# Patient Record
Sex: Female | Born: 1965 | Race: White | Hispanic: No | Marital: Married | Smoking: Never smoker
Health system: Southern US, Community
[De-identification: ages and names within clinical notes are randomized; demographics above are authoritative.]

## PROBLEM LIST (undated history)

## (undated) DIAGNOSIS — I10 Essential (primary) hypertension: Secondary | ICD-10-CM

## (undated) DIAGNOSIS — G009 Bacterial meningitis, unspecified: Secondary | ICD-10-CM

## (undated) DIAGNOSIS — T7840XA Allergy, unspecified, initial encounter: Secondary | ICD-10-CM

## (undated) DIAGNOSIS — K219 Gastro-esophageal reflux disease without esophagitis: Secondary | ICD-10-CM

## (undated) DIAGNOSIS — F419 Anxiety disorder, unspecified: Secondary | ICD-10-CM

## (undated) HISTORY — DX: Essential (primary) hypertension: I10

## (undated) HISTORY — PX: OTHER SURGICAL HISTORY: SHX169

## (undated) HISTORY — DX: Gastro-esophageal reflux disease without esophagitis: K21.9

## (undated) HISTORY — PX: NO PAST SURGERIES: SHX2092

## (undated) HISTORY — DX: Allergy, unspecified, initial encounter: T78.40XA

## (undated) HISTORY — DX: Bacterial meningitis, unspecified: G00.9

## (undated) HISTORY — DX: Anxiety disorder, unspecified: F41.9

---
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
## 1986-01-11 HISTORY — PX: WISDOM TOOTH EXTRACTION: SHX21

## 1998-02-07 ENCOUNTER — Other Ambulatory Visit: Admission: RE | Admit: 1998-02-07 | Discharge: 1998-02-07 | Payer: Self-pay | Admitting: Obstetrics and Gynecology

## 1998-06-03 ENCOUNTER — Ambulatory Visit (HOSPITAL_COMMUNITY): Admission: RE | Admit: 1998-06-03 | Discharge: 1998-06-03 | Payer: Self-pay | Admitting: Obstetrics and Gynecology

## 1998-06-03 ENCOUNTER — Encounter: Payer: Self-pay | Admitting: Obstetrics and Gynecology

## 1998-06-19 ENCOUNTER — Other Ambulatory Visit: Admission: RE | Admit: 1998-06-19 | Discharge: 1998-06-19 | Payer: Self-pay | Admitting: Obstetrics and Gynecology

## 2007-06-12 ENCOUNTER — Emergency Department: Payer: Self-pay | Admitting: Emergency Medicine

## 2007-08-14 ENCOUNTER — Ambulatory Visit: Payer: Self-pay | Admitting: Family Medicine

## 2011-08-13 ENCOUNTER — Other Ambulatory Visit: Payer: Self-pay | Admitting: Obstetrics and Gynecology

## 2011-08-13 DIAGNOSIS — R928 Other abnormal and inconclusive findings on diagnostic imaging of breast: Secondary | ICD-10-CM

## 2011-08-26 ENCOUNTER — Ambulatory Visit
Admission: RE | Admit: 2011-08-26 | Discharge: 2011-08-26 | Disposition: A | Discharge status: Home / Self Care | Payer: BC Managed Care – PPO | Source: Ambulatory Visit | Attending: Obstetrics and Gynecology | Admitting: Obstetrics and Gynecology

## 2011-08-26 DIAGNOSIS — R928 Other abnormal and inconclusive findings on diagnostic imaging of breast: Secondary | ICD-10-CM

## 2014-07-20 ENCOUNTER — Ambulatory Visit (INDEPENDENT_AMBULATORY_CARE_PROVIDER_SITE_OTHER): Payer: BLUE CROSS/BLUE SHIELD | Admitting: Urgent Care

## 2014-07-20 VITALS — BP 120/80 | HR 78 | Temp 97.8°F | Ht 65.0 in | Wt 152.5 lb

## 2014-07-20 DIAGNOSIS — J029 Acute pharyngitis, unspecified: Secondary | ICD-10-CM

## 2014-07-20 LAB — POCT RAPID STREP A (OFFICE): Rapid Strep A Screen: NEGATIVE

## 2014-07-20 NOTE — Progress Notes (Signed)
    MRN: [id number] DOB: [birth year]  Subjective:   Kaylee Lopez is a 49 y.o. female presenting for chief complaint of Sore Throat  Reports 3 day history of sore throat and chest tightness, malaise, nausea and stomach upset. Has not tried any medications for relief. Denies subjective fever, sinus congestion, sinus pain, rhinorrhea, red eyes, ear pain, ear drainage, wheezing, shortness of breath, chest pain, myalgia and cough, vomiting, abdominal pain and diarrhea. Has had multiple sick contacts with Strep including her husband and extended family. Denies any other aggravating or relieving factors, no other questions or concerns.  Kaylee Lopez has a current medication list which includes the following prescription(s): loratadine. She has No Known Allergies.  Kaylee Lopez  has a past medical history of Allergy. Also  has no past surgical history on file.  ROS As in subjective.  Objective:   Vitals: BP 120/80 mmHg  Pulse 78  Temp(Src) 97.8 F (36.6 C) (Oral)  Ht 5\' 5"  (1.651 m)  Wt 152 lb 8 oz (69.174 kg)  BMI 25.38 kg/m2  SpO2 99%  LMP 07/06/2014 (Approximate)  Physical Exam  Constitutional: She is oriented to person, place, and time. She appears well-developed and well-nourished.  HENT:  TM's intact bilaterally, no effusions or erythema. Nasal turbinates pink and moist with mucus. No sinus tenderness. Postnasal drip present but without oropharyngeal erythema, exudates or abscesses.  Eyes: Conjunctivae are normal. Right eye exhibits no discharge. Left eye exhibits no discharge. No scleral icterus.  Neck: Normal range of motion. Neck supple.  Cardiovascular: Normal rate, regular rhythm and intact distal pulses.  Exam reveals no gallop and no friction rub.   No murmur heard. Pulmonary/Chest: No stridor. No respiratory distress. She has no wheezes. She has no rales.  Abdominal: Soft. Bowel sounds are normal. She exhibits no distension and no mass. There is no tenderness.    Lymphadenopathy:    She has cervical adenopathy (bilateral, anterior).  Neurological: She is alert and oriented to person, place, and time.  Skin: Skin is warm and dry. No rash noted. No erythema. No pallor.   Results for orders placed or performed in visit on 07/20/14 (from the past 24 hour(s))  POCT rapid strep A     Status: None   Collection Time: 07/20/14 12:51 PM  Result Value Ref Range   Rapid Strep A Screen Negative Negative   Assessment and Plan :   1. Sore throat - Strep culture pending, offered antibiotic course given exposure to both her brother and father who both tested positive for Strep throat but mother declined and said she'd like to wait for strep culture.  - Advised she call me if symptoms worsen or fail to improve and I'll send out rx for amoxicillin. Mother agreed.   Kaylee Eagles, PA-C [facility] [phone] 07/20/2014 12:28 PM

## 2014-07-20 NOTE — Patient Instructions (Signed)

## 2014-07-22 LAB — CULTURE, GROUP A STREP: Organism ID, Bacteria: NORMAL

## 2014-08-05 ENCOUNTER — Ambulatory Visit (INDEPENDENT_AMBULATORY_CARE_PROVIDER_SITE_OTHER): Payer: BLUE CROSS/BLUE SHIELD | Admitting: Physician Assistant

## 2014-08-05 VITALS — BP 112/74 | HR 71 | Temp 98.2°F | Resp 18 | Ht 65.0 in | Wt 153.0 lb

## 2014-08-05 DIAGNOSIS — R109 Unspecified abdominal pain: Secondary | ICD-10-CM | POA: Diagnosis not present

## 2014-08-05 DIAGNOSIS — R1031 Right lower quadrant pain: Secondary | ICD-10-CM

## 2014-08-05 DIAGNOSIS — R110 Nausea: Secondary | ICD-10-CM | POA: Diagnosis not present

## 2014-08-05 LAB — COMPREHENSIVE METABOLIC PANEL
ALT: 10 U/L (ref 6–29)
AST: 15 U/L (ref 10–35)
Albumin: 4.6 g/dL (ref 3.6–5.1)
Alkaline Phosphatase: 46 U/L (ref 33–115)
BUN: 12 mg/dL (ref 7–25)
CALCIUM: 9.4 mg/dL (ref 8.6–10.2)
CHLORIDE: 102 mmol/L (ref 98–110)
CO2: 24 mmol/L (ref 20–31)
CREATININE: 0.62 mg/dL (ref 0.50–1.10)
Glucose, Bld: 77 mg/dL (ref 65–99)
POTASSIUM: 3.7 mmol/L (ref 3.5–5.3)
Sodium: 137 mmol/L (ref 135–146)
Total Bilirubin: 1.2 mg/dL (ref 0.2–1.2)
Total Protein: 7.1 g/dL (ref 6.1–8.1)

## 2014-08-05 LAB — POCT CBC
GRANULOCYTE PERCENT: 63.3 % (ref 37–80)
HEMATOCRIT: 41 % (ref 37.7–47.9)
Hemoglobin: 13.7 g/dL (ref 12.2–16.2)
Lymph, poc: 2.4 (ref 0.6–3.4)
MCH: 29.9 pg (ref 27–31.2)
MCHC: 33.4 g/dL (ref 31.8–35.4)
MCV: 89.5 fL (ref 80–97)
MID (cbc): 0.7 (ref 0–0.9)
MPV: 7.6 fL (ref 0–99.8)
POC Granulocyte: 5.3 (ref 2–6.9)
POC LYMPH %: 28.7 % (ref 10–50)
POC MID %: 8 %M (ref 0–12)
Platelet Count, POC: 249 10*3/uL (ref 142–424)
RBC: 4.58 M/uL (ref 4.04–5.48)
RDW, POC: 12.5 %
WBC: 8.3 10*3/uL (ref 4.6–10.2)

## 2014-08-05 LAB — POCT URINALYSIS DIPSTICK
Bilirubin, UA: NEGATIVE
Glucose, UA: NEGATIVE
Leukocytes, UA: NEGATIVE
Nitrite, UA: NEGATIVE
Protein, UA: NEGATIVE
Spec Grav, UA: <=1.005
Urobilinogen, UA: 0.2
pH, UA: 5.5

## 2014-08-05 LAB — POCT URINE PREGNANCY: Preg Test, Ur: NEGATIVE

## 2014-08-05 LAB — POCT UA - MICROSCOPIC ONLY
BACTERIA, U MICROSCOPIC: NEGATIVE
CASTS, UR, LPF, POC: NEGATIVE
Crystals, Ur, HPF, POC: NEGATIVE
Mucus, UA: NEGATIVE
WBC, UR, HPF, POC: NEGATIVE
YEAST UA: NEGATIVE

## 2014-08-05 MED ORDER — POLYETHYLENE GLYCOL 3350 17 GM/SCOOP PO POWD: 17.0000 g | Freq: Two times a day (BID) | ORAL | Status: AC | PRN

## 2014-08-05 NOTE — Progress Notes (Signed)
Subjective:    Patient ID: Kaylee Lopez, female    DOB: [birth year], 49 y.o.   MRN: [id number]  Chief Complaint  Patient presents with  . Urinary Tract Infection    x 1 week, right flank pain  . Abdominal Pain  . Nausea   Medications, allergies, past medical history, surgical history, family history, social history and problem list reviewed and updated.  HPI  49 yof presents with above complaints.   Sx started one wk ago with sharp, shooting right lower quad pain. Intermittent for past week. At times pain will radiate across lower abd. Pain brought on and aggravated by certain movements such as getting in and out of car, moving right foot between pedals. Went cycling this weekend and did not aggravate pain.   Pain not assoc with meals. Denies fevers, chills. Denies emesis, diarrhea. Mild nausea past few days. No dysuria, urinary freq/urgency, vaginal dc, odor, dyspareunia. She has normal bm approx every other day. Unsure if she is constipated.   Review of Systems See HPI.     Objective:   Physical Exam  Constitutional: She is oriented to person, place, and time. She appears well-developed and well-nourished.  Non-toxic appearance. She does not have a sickly appearance. She does not appear ill. No distress.  BP 112/74 mmHg  Pulse 71  Temp(Src) 98.2 F (36.8 C)  Resp 18  Ht 5\' 5"  (1.651 m)  Wt 153 lb (69.4 kg)  BMI 25.46 kg/m2  SpO2 98%  LMP 07/06/2014 (Approximate)   Cardiovascular: Normal rate, regular rhythm and normal heart sounds.   Pulmonary/Chest: Effort normal and breath sounds normal.  Abdominal: Soft. Normal appearance and bowel sounds are normal. There is no hepatosplenomegaly. There is tenderness in the right lower quadrant, periumbilical area and suprapubic area. There is tenderness at McBurney's point. There is no rigidity, no rebound, no guarding, no CVA tenderness and negative Murphy's sign.  Mild-mod RLQ ttp. Positive Rovsing's.   Genitourinary: Uterus  is not tender. Cervix exhibits no motion tenderness and no discharge. Right adnexum displays no mass, no tenderness and no fullness. Left adnexum displays no mass, no tenderness and no fullness. No tenderness in the vagina. No vaginal discharge found.  Lymphadenopathy:       Right: No inguinal adenopathy present.       Left: No inguinal adenopathy present.  Neurological: She is alert and oriented to person, place, and time.  Psychiatric: She has a normal mood and affect. Her speech is normal and behavior is normal.   Results for orders placed or performed in visit on 08/05/14  POCT urinalysis dipstick  Result Value Ref Range   Color, UA yellow    Clarity, UA clear    Glucose, UA neg    Bilirubin, UA neg    Ketones, UA trace    Spec Grav, UA <=1.005    Blood, UA small    pH, UA 5.5    Protein, UA neg    Urobilinogen, UA 0.2    Nitrite, UA neg    Leukocytes, UA Negative Negative  POCT UA - Microscopic Only  Result Value Ref Range   WBC, Ur, HPF, POC neg    RBC, urine, microscopic 0-1    Bacteria, U Microscopic neg    Mucus, UA neg    Epithelial cells, urine per micros 0-1    Crystals, Ur, HPF, POC neg    Casts, Ur, LPF, POC neg    Yeast, UA neg   POCT CBC  Result  Value Ref Range   WBC 8.3 4.6 - 10.2 K/uL   Lymph, poc 2.4 0.6 - 3.4   POC LYMPH PERCENT 28.7 10 - 50 %L   MID (cbc) 0.7 0 - 0.9   POC MID % 8.0 0 - 12 %M   POC Granulocyte 5.3 2 - 6.9   Granulocyte percent 63.3 37 - 80 %G   RBC 4.58 4.04 - 5.48 M/uL   Hemoglobin 13.7 12.2 - 16.2 g/dL   HCT, POC 41.0 37.7 - 47.9 %   MCV 89.5 80 - 97 fL   MCH, POC 29.9 27 - 31.2 pg   MCHC 33.4 31.8 - 35.4 g/dL   RDW, POC 12.5 %   Platelet Count, POC 249 142 - 424 K/uL   MPV 7.6 0 - 99.8 fL  POCT urine pregnancy  Result Value Ref Range   Preg Test, Ur Negative Negative      Assessment & Plan:   Right lower quadrant pain - Plan: POCT CBC, POCT urine pregnancy, Comprehensive metabolic panel, polyethylene glycol powder  (GLYCOLAX/MIRALAX) powder  Rt flank pain - Plan: POCT urinalysis dipstick, POCT UA - Microscopic Only, Comprehensive metabolic panel  Nausea without vomiting - Plan: POCT urine pregnancy, Comprehensive metabolic panel --no leukocytosis, normal ua, neg urine preg all normal and reassuring --ttp rlq and positive rovsings but no guarding, rebound, or peritoneal signs --normal vaginal exam with no cmt --suspect constipation vs ms strain --miralax, information on constipation given/reviewed --if no relief one week rtc for further workup, possible imaging --er with fevers, chills, worsening pain  Julieta Gutting, PA-C Physician Assistant[facility]  08/05/2014 7:06 PM

## 2014-08-05 NOTE — Patient Instructions (Signed)
Please take the miralax once daily for the next 1-2 weeks. If the pain does not resolve with this please let us know. If the pain worsens or you start having fevers or chills please let us know asap or go to the ER.   Constipation Constipation is when a person has fewer than three bowel movements a week, has difficulty having a bowel movement, or has stools that are dry, hard, or larger than normal. As people grow older, constipation is more common. If you try to fix constipation with medicines that make you have a bowel movement (laxatives), the problem may get worse. Long-term laxative use may cause the muscles of the colon to become weak. A low-fiber diet, not taking in enough fluids, and taking certain medicines may make constipation worse.  CAUSES   Certain medicines, such as antidepressants, pain medicine, iron supplements, antacids, and water pills.   Certain diseases, such as diabetes, irritable bowel syndrome (IBS), thyroid disease, or depression.   Not drinking enough water.   Not eating enough fiber-rich foods.   Stress or travel.   Lack of physical activity or exercise.   Ignoring the urge to have a bowel movement.   Using laxatives too much.  SIGNS AND SYMPTOMS   Having fewer than three bowel movements a week.   Straining to have a bowel movement.   Having stools that are hard, dry, or larger than normal.   Feeling full or bloated.   Pain in the lower abdomen.   Not feeling relief after having a bowel movement.  DIAGNOSIS  Your health care provider will take a medical history and perform a physical exam. Further testing may be done for severe constipation. Some tests may include:  A barium enema X-ray to examine your rectum, colon, and, sometimes, your small intestine.   A sigmoidoscopy to examine your lower colon.   A colonoscopy to examine your entire colon. TREATMENT  Treatment will depend on the severity of your constipation and what is  causing it. Some dietary treatments include drinking more fluids and eating more fiber-rich foods. Lifestyle treatments may include regular exercise. If these diet and lifestyle recommendations do not help, your health care provider may recommend taking over-the-counter laxative medicines to help you have bowel movements. Prescription medicines may be prescribed if over-the-counter medicines do not work.  HOME CARE INSTRUCTIONS   Eat foods that have a lot of fiber, such as fruits, vegetables, whole grains, and beans.  Limit foods high in fat and processed sugars, such as french fries, hamburgers, cookies, candies, and soda.   A fiber supplement may be added to your diet if you cannot get enough fiber from foods.   Drink enough fluids to keep your urine clear or pale yellow.   Exercise regularly or as directed by your health care provider.   Go to the restroom when you have the urge to go. Do not hold it.   Only take over-the-counter or prescription medicines as directed by your health care provider. Do not take other medicines for constipation without talking to your health care provider first.  [place] IF:   You have bright red blood in your stool.   Your constipation lasts for more than 4 days or gets worse.   You have abdominal or rectal pain.   You have thin, pencil-like stools.   You have unexplained weight loss. MAKE SURE YOU:   Understand these instructions.  Will watch your condition.  Will get help right away  if you are not doing well or get worse. Document Released: 09/26/2003 Document Revised: 01/02/2013 Document Reviewed: 10/09/2012 [facility] Patient Information 2015 [place], [place]. This information is not intended to replace advice given to you by your health care provider. Make sure you discuss any questions you have with your health care provider.

## 2014-09-26 ENCOUNTER — Other Ambulatory Visit: Payer: Self-pay | Admitting: Obstetrics and Gynecology

## 2014-09-26 DIAGNOSIS — Z803 Family history of malignant neoplasm of breast: Secondary | ICD-10-CM

## 2014-10-15 ENCOUNTER — Ambulatory Visit
Admission: RE | Admit: 2014-10-15 | Discharge: 2014-10-15 | Disposition: A | Discharge status: Home / Self Care | Payer: BLUE CROSS/BLUE SHIELD | Source: Ambulatory Visit | Attending: Obstetrics and Gynecology | Admitting: Obstetrics and Gynecology

## 2014-10-15 DIAGNOSIS — Z803 Family history of malignant neoplasm of breast: Secondary | ICD-10-CM

## 2014-10-15 MED ORDER — GADOBENATE DIMEGLUMINE 529 MG/ML IV SOLN
14.0000 mL | Freq: Once | INTRAVENOUS | Status: AC | PRN
  Administered 2014-10-15: 14 mL via INTRAVENOUS

## 2014-10-16 ENCOUNTER — Other Ambulatory Visit: Payer: Self-pay | Admitting: Obstetrics and Gynecology

## 2014-10-16 DIAGNOSIS — Z803 Family history of malignant neoplasm of breast: Secondary | ICD-10-CM

## 2014-10-18 ENCOUNTER — Other Ambulatory Visit: Payer: Self-pay

## 2014-10-18 ENCOUNTER — Ambulatory Visit
Admission: RE | Admit: 2014-10-18 | Discharge: 2014-10-18 | Disposition: A | Discharge status: Home / Self Care | Payer: BLUE CROSS/BLUE SHIELD | Source: Ambulatory Visit | Attending: Obstetrics and Gynecology | Admitting: Obstetrics and Gynecology

## 2014-10-18 DIAGNOSIS — Z803 Family history of malignant neoplasm of breast: Secondary | ICD-10-CM

## 2014-10-29 ENCOUNTER — Other Ambulatory Visit: Payer: BLUE CROSS/BLUE SHIELD

## 2016-01-12 HISTORY — PX: COLONOSCOPY: SHX174

## 2016-01-12 HISTORY — PX: POLYPECTOMY: SHX149

## 2016-04-02 ENCOUNTER — Ambulatory Visit (INDEPENDENT_AMBULATORY_CARE_PROVIDER_SITE_OTHER): Payer: BLUE CROSS/BLUE SHIELD | Admitting: Family Medicine

## 2016-04-02 VITALS — BP 142/86 | HR 72 | Temp 97.7°F | Resp 18 | Ht 65.0 in | Wt 177.0 lb

## 2016-04-02 DIAGNOSIS — J302 Other seasonal allergic rhinitis: Secondary | ICD-10-CM | POA: Diagnosis not present

## 2016-04-02 DIAGNOSIS — H6123 Impacted cerumen, bilateral: Secondary | ICD-10-CM

## 2016-04-02 DIAGNOSIS — R030 Elevated blood-pressure reading, without diagnosis of hypertension: Secondary | ICD-10-CM

## 2016-04-02 DIAGNOSIS — H65192 Other acute nonsuppurative otitis media, left ear: Secondary | ICD-10-CM

## 2016-04-02 DIAGNOSIS — H93A1 Pulsatile tinnitus, right ear: Secondary | ICD-10-CM

## 2016-04-02 MED ORDER — CETIRIZINE HCL 10 MG PO TABS: 10.0000 mg | ORAL_TABLET | Freq: Every day | ORAL | 11 refills | Status: DC

## 2016-04-02 MED ORDER — FLUTICASONE PROPIONATE 50 MCG/ACT NA SUSP: 2.0000 | Freq: Every day | NASAL | 2 refills | Status: DC

## 2016-04-02 NOTE — Patient Instructions (Addendum)
IF you received an x-ray today, you will receive an invoice from [facility]. Please contact [facility] at [phone] with questions or concerns regarding your invoice.   IF you received labwork today, you will receive an invoice from [place]. Please contact [facility] at [phone] with questions or concerns regarding your invoice.   Our billing staff will not be able to assist you with questions regarding bills from these companies.  You will be contacted with the lab results as soon as they are available. The fastest way to get your results is to activate your My Chart account. Instructions are located on the last page of this paperwork. If you have not heard from [place] regarding the results in 2 weeks, please contact this office.     Eustachian Tube Dysfunction The eustachian tube connects the middle ear to the back of the nose. It regulates air pressure in the middle ear by allowing air to move between the ear and nose. It also helps to drain fluid from the middle ear space. When the eustachian tube does not function properly, air pressure, fluid, or both can build up in the middle ear. Eustachian tube dysfunction can affect one or both ears. What are the causes? This condition happens when the eustachian tube becomes blocked or cannot open normally. This may result from:  Ear infections.  Colds and other upper respiratory infections.  Allergies.  Irritation, such as from cigarette smoke or acid from the stomach coming up into the esophagus (gastroesophageal reflux).  Sudden changes in air pressure, such as from descending in an airplane.  Abnormal growths in the nose or throat, such as nasal polyps, tumors, or enlarged tissue at the back of the throat (adenoids). What increases the risk? This condition may be more likely to develop in people who smoke and people who are overweight. Eustachian tube dysfunction may also be more likely to develop in children,  especially children who have:  Certain birth defects of the mouth, such as cleft palate.  Large tonsils and adenoids. What are the signs or symptoms? Symptoms of this condition may include:  A feeling of fullness in the ear.  Ear pain.  Clicking or popping noises in the ear.  Ringing in the ear.  Hearing loss.  Loss of balance. Symptoms may get worse when the air pressure around you changes, such as when you travel to an area of high elevation or fly on an airplane. How is this diagnosed? This condition may be diagnosed based on:  Your symptoms.  A physical exam of your ear, nose, and throat.  Tests, such as those that measure:  The movement of your eardrum (tympanogram).  Your hearing (audiometry). How is this treated? Treatment depends on the cause and severity of your condition. If your symptoms are mild, you may be able to relieve your symptoms by moving air into ("popping") your ears. If you have symptoms of fluid in your ears, treatment may include:  Decongestants.  Antihistamines.  Nasal sprays or ear drops that contain medicines that reduce swelling (steroids). In some cases, you may need to have a procedure to drain the fluid in your eardrum (myringotomy). In this procedure, a small tube is placed in the eardrum to:  Drain the fluid.  Restore the air in the middle ear space. Follow these instructions at home:  Take over-the-counter and prescription medicines only as told by your health care provider.  Use techniques to help pop your ears as recommended by your  health care provider. These may include:  Chewing gum.  Yawning.  Frequent, forceful swallowing.  Closing your mouth, holding your nose closed, and gently blowing as if you are trying to blow air out of your nose.  Do not do any of the following until your health care provider approves:  Travel to high altitudes.  Fly in airplanes.  Work in a Pension scheme manager or room.  Scuba  dive.  Keep your ears dry. Dry your ears completely after showering or bathing.  Do not smoke.  Keep all follow-up visits as told by your health care provider. This is important. Contact a health care provider if:  Your symptoms do not go away after treatment.  Your symptoms come back after treatment.  You are unable to pop your ears.  You have:  A fever.  Pain in your ear.  Pain in your head or neck.  Fluid draining from your ear.  Your hearing suddenly changes.  You become very dizzy.  You lose your balance. This information is not intended to replace advice given to you by your health care provider. Make sure you discuss any questions you have with your health care provider. Document Released: 01/24/2015 Document Revised: 06/05/2015 Document Reviewed: 01/16/2014 Elsevier Interactive Patient Education  2017 [place] DASH stands for "Dietary Approaches to Stop Hypertension." The DASH eating plan is a healthy eating plan that has been shown to reduce high blood pressure (hypertension). It may also reduce your risk for type 2 diabetes, heart disease, and stroke. The DASH eating plan may also help with weight loss. What are tips for following this plan? General guidelines   Avoid eating more than 2,300 mg (milligrams) of salt (sodium) a day. If you have hypertension, you may need to reduce your sodium intake to 1,500 mg a day.  Limit alcohol intake to no more than 1 drink a day for nonpregnant women and 2 drinks a day for men. One drink equals 12 oz of beer, 5 oz of wine, or 1 oz of hard liquor.  Work with your health care provider to maintain a healthy body weight or to lose weight. Ask what an ideal weight is for you.  Get at least 30 minutes of exercise that causes your heart to beat faster (aerobic exercise) most days of the week. Activities may include walking, swimming, or biking.  Work with your health care provider or diet and nutrition  specialist (dietitian) to adjust your eating plan to your individual calorie needs. Reading food labels   Check food labels for the amount of sodium per serving. Choose foods with less than 5 percent of the Daily Value of sodium. Generally, foods with less than 300 mg of sodium per serving fit into this eating plan.  To find whole grains, look for the word "whole" as the first word in the ingredient list. Shopping   Buy products labeled as "low-sodium" or "no salt added."  Buy fresh foods. Avoid canned foods and premade or frozen meals. Cooking   Avoid adding salt when cooking. Use salt-free seasonings or herbs instead of table salt or sea salt. Check with your health care provider or pharmacist before using salt substitutes.  Do not fry foods. Cook foods using healthy methods such as baking, boiling, grilling, and broiling instead.  Cook with heart-healthy oils, such as olive, canola, soybean, or sunflower oil. Meal planning    Eat a balanced diet that includes:  5 or more servings of fruits and  vegetables each day. At each meal, try to fill half of your plate with fruits and vegetables.  Up to 6-8 servings of whole grains each day.  Less than 6 oz of lean meat, poultry, or fish each day. A 3-oz serving of meat is about the same size as a deck of cards. One egg equals 1 oz.  2 servings of low-fat dairy each day.  A serving of nuts, seeds, or beans 5 times each week.  Heart-healthy fats. Healthy fats called Omega-3 fatty acids are found in foods such as flaxseeds and coldwater fish, like sardines, salmon, and mackerel.  Limit how much you eat of the following:  Canned or prepackaged foods.  Food that is high in trans fat, such as fried foods.  Food that is high in saturated fat, such as fatty meat.  Sweets, desserts, sugary drinks, and other foods with added sugar.  Full-fat dairy products.  Do not salt foods before eating.  Try to eat at least 2 vegetarian meals each  week.  Eat more home-cooked food and less restaurant, buffet, and fast food.  When eating at a restaurant, ask that your food be prepared with less salt or no salt, if possible. What foods are recommended? The items listed may not be a complete list. Talk with your dietitian about what dietary choices are best for you. Grains  Whole-grain or whole-wheat bread. Whole-grain or whole-wheat pasta. Brown rice. [place]. Bulgur. Whole-grain and low-sodium cereals. Pita bread. Low-fat, low-sodium crackers. Whole-wheat flour tortillas. Vegetables  Fresh or frozen vegetables (raw, steamed, roasted, or grilled). Low-sodium or reduced-sodium tomato and vegetable juice. Low-sodium or reduced-sodium tomato sauce and tomato paste. Low-sodium or reduced-sodium canned vegetables. Fruits  All fresh, dried, or frozen fruit. Canned fruit in natural juice (without added sugar). Meat and other protein foods  Skinless chicken or [place]. Ground chicken or [place]. Pork with fat trimmed off. Fish and seafood. Egg whites. Dried beans, peas, or lentils. Unsalted nuts, nut butters, and seeds. Unsalted canned beans. Lean cuts of beef with fat trimmed off. Low-sodium, lean deli meat. Dairy  Low-fat (1%) or fat-free (skim) milk. Fat-free, low-fat, or reduced-fat cheeses. Nonfat, low-sodium ricotta or cottage cheese. Low-fat or nonfat yogurt. Low-fat, low-sodium cheese. Fats and oils  Soft margarine without trans fats. Vegetable oil. Low-fat, reduced-fat, or light mayonnaise and salad dressings (reduced-sodium). Canola, safflower, olive, soybean, and sunflower oils. Avocado. Seasoning and other foods  Herbs. Spices. Seasoning mixes without salt. Unsalted popcorn and pretzels. Fat-free sweets. What foods are not recommended? The items listed may not be a complete list. Talk with your dietitian about what dietary choices are best for you. Grains  Baked goods made with fat, such as croissants, muffins, or some breads.  Dry pasta or rice meal packs. Vegetables  Creamed or fried vegetables. Vegetables in a cheese sauce. Regular canned vegetables (not low-sodium or reduced-sodium). Regular canned tomato sauce and paste (not low-sodium or reduced-sodium). Regular tomato and vegetable juice (not low-sodium or reduced-sodium). Angie Fava. Olives. Fruits  Canned fruit in a light or heavy syrup. Fried fruit. Fruit in cream or butter sauce. Meat and other protein foods  Fatty cuts of meat. Ribs. Fried meat. Berniece Salines. Sausage. Bologna and other processed lunch meats. Salami. Fatback. Hotdogs. Bratwurst. Salted nuts and seeds. Canned beans with added salt. Canned or smoked fish. Whole eggs or egg yolks. Chicken or [place] with skin. Dairy  Whole or 2% milk, cream, and half-and-half. Whole or full-fat cream cheese. Whole-fat or sweetened yogurt. Full-fat cheese.  Nondairy creamers. Whipped toppings. Processed cheese and cheese spreads. Fats and oils  Butter. Stick margarine. Lard. Shortening. Ghee. Bacon fat. Tropical oils, such as coconut, palm kernel, or palm oil. Seasoning and other foods  Salted popcorn and pretzels. Onion salt, garlic salt, seasoned salt, table salt, and sea salt. Worcestershire sauce. Tartar sauce. Barbecue sauce. Teriyaki sauce. Soy sauce, including reduced-sodium. Steak sauce. Canned and packaged gravies. Fish sauce. Oyster sauce. Cocktail sauce. Horseradish that you find on the shelf. Ketchup. Mustard. Meat flavorings and tenderizers. Bouillon cubes. Hot sauce and Tabasco sauce. Premade or packaged marinades. Premade or packaged taco seasonings. Relishes. Regular salad dressings. Where to find more information:  National Heart, Lung, and [place]: https://wilson-eaton.com/  American Heart Association: www.heart.org Summary  The DASH eating plan is a healthy eating plan that has been shown to reduce high blood pressure (hypertension). It may also reduce your risk for type 2 diabetes, heart disease, and  stroke.  With the DASH eating plan, you should limit salt (sodium) intake to 2,300 mg a day. If you have hypertension, you may need to reduce your sodium intake to 1,500 mg a day.  When on the DASH eating plan, aim to eat more fresh fruits and vegetables, whole grains, lean proteins, low-fat dairy, and heart-healthy fats.  Work with your health care provider or diet and nutrition specialist (dietitian) to adjust your eating plan to your individual calorie needs. This information is not intended to replace advice given to you by your health care provider. Make sure you discuss any questions you have with your health care provider. Document Released: 12/17/2010 Document Revised: 12/22/2015 Document Reviewed: 12/22/2015 Elsevier Interactive Patient Education  2017 [employer].

## 2016-04-02 NOTE — Progress Notes (Signed)
Subjective:  By signing my name below, I, [name], attest that this documentation has been prepared under the direction and in the presence of Delman Cheadle, MD Electronically Signed: Ladene Artist, ED Scribe 04/02/2016 at 9:03 AM.   Patient ID: Kaylee Lopez, female    DOB: 12/04/1965, 51 y.o.   MRN: [id number]  Chief Complaint  Patient presents with  . Ear Problem    Pt states she hears her heart beat right ear, also skin feels itchy around that ear   HPI Kaylee Lopez is a 51 y.o. female who presents to Primary Care at [facility] complaining of a constant pulsatile sensation in the right ear for several months. Pt reports associated right ear itching. She reports a h/o environmental allergies that she treats with Claritin and a saline flush. She has also tried Debrox. Pt sees GYN Kaylee Nigh, MD annually; does not have a PCP.   Elevated BP Pt's prior BP has been normal. Triage BP: 142/86. Pt does not monitor her salt intake. She is consuming 32 oz bottle of water/daily and drinks plenty of seltzer. She walks her dog for exercise and is trying to get back into running. Pt has noticed that she has gained weight recently. She reports drinking wine occasionally during the week and on the weekends; states she had a glass last night.   Past Medical History:  Diagnosis Date  . Allergy    Current Outpatient Prescriptions on File Prior to Visit  Medication Sig Dispense Refill  . loratadine (CLARITIN) 10 MG tablet Take 10 mg by mouth daily as needed for allergies.    . polyethylene glycol powder (GLYCOLAX/MIRALAX) powder Take 17 g by mouth 2 (two) times daily as needed. 3350 g 1   No current facility-administered medications on file prior to visit.    No Known Allergies   No past surgical history on file. Family History  Problem Relation Age of Onset  . Heart disease Mother   . Hypertension Mother   . Hyperlipidemia Father   . Cancer Maternal Grandmother   . Heart disease  Maternal Grandfather   . Hypertension Maternal Grandfather   . Stroke Paternal Grandmother    Social History   Social History  . Marital status: Married    Spouse name: N/A  . Number of children: N/A  . Years of education: N/A   Social History Main Topics  . Smoking status: Never Smoker  . Smokeless tobacco: Never Used  . Alcohol use 0.0 oz/week  . Drug use: No  . Sexual activity: Not Asked   Other Topics Concern  . None   Social History Narrative  . None   Depression screen [facility] 2/9 04/02/2016 08/05/2014 07/20/2014  Decreased Interest 0 0 0  Down, Depressed, Hopeless 0 0 0  PHQ - 2 Score 0 0 0     Review of Systems  Constitutional: Negative for activity change, appetite change, chills, diaphoresis and fever.  HENT: Positive for postnasal drip, rhinorrhea and tinnitus (pulsatile). Negative for congestion, ear discharge, ear pain, facial swelling, hearing loss, mouth sores, nosebleeds, sinus pain, sinus pressure, sneezing, sore throat, trouble swallowing and voice change.   Respiratory: Negative for cough, chest tightness, shortness of breath and wheezing.   Cardiovascular: Negative for chest pain, palpitations and leg swelling.  Musculoskeletal: Negative for neck pain and neck stiffness.  Skin: Negative for color change, pallor and rash.  Allergic/Immunologic: Positive for environmental allergies. Negative for immunocompromised state.  Neurological: Negative for dizziness, facial asymmetry, light-headedness and  headaches.      Objective:   Physical Exam  Constitutional: She is oriented to person, place, and time. She appears well-developed and well-nourished. No distress.  HENT:  Head: Normocephalic and atraumatic.  Right Ear: A middle ear effusion (small) is present.  Left Ear: Tympanic membrane normal.  Nose: Mucosal edema present.  Mouth/Throat: Oropharynx is clear and moist.  Small nasal passages. Large cerumen in each ear, removed by myself by curette.  Eyes:  Conjunctivae and EOM are normal.  Neck: Neck supple. Carotid bruit is not present. No tracheal deviation present. No thyromegaly present.  Cardiovascular: Normal rate, regular rhythm, S1 normal, S2 normal and normal heart sounds.   Pulmonary/Chest: Effort normal and breath sounds normal. No respiratory distress.  Musculoskeletal: Normal range of motion.  Lymphadenopathy:    She has no cervical adenopathy.  Neurological: She is alert and oriented to person, place, and time.  Skin: Skin is warm and dry.  Psychiatric: She has a normal mood and affect. Her behavior is normal.  Nursing note and vitals reviewed.  BP (!) 142/86   Pulse 72   Temp 97.7 F (36.5 C) (Oral)   Resp 18   Ht 5\' 5"  (1.651 m)   Wt 177 lb (80.3 kg)   LMP 10/27/2015   SpO2 97%   BMI 29.45 kg/m     Bilateral cerumen removed completely by myself with curette - pt tolerated procedure well and reported almost immed improvement in sxs. Assessment & Plan:   1. Pulsatile tinnitus of right ear - suspect due to middle ear effusion with ETD but sxs already sig improved after removal of cerumen bilaterally. Start below. If no improvement will refer to ENT  2. Acute middle ear effusion, left   3. Acute seasonal allergic rhinitis, unspecified trigger   4. Single episode of elevated blood pressure - do not have any other recent readings - reviewed lifestyle recs/tlc/dash diet. Pt will recheck in 3-4 wks with fasting labs at that time. Pt does not have any other sig medical prob so would be good to have bp goal 110/70 w/ low threshold to start antihypertensive trx to see if helps pulsatile tinnitus   Recheck in 1 mo for establish care, recheck above, rec fasting labs - ok to sched this for CPE if pt prefers.  Meds ordered this encounter  Medications  . cetirizine (ZYRTEC) 10 MG tablet    Sig: Take 1 tablet (10 mg total) by mouth at bedtime.    Dispense:  30 tablet    Refill:  11  . fluticasone (FLONASE) 50 MCG/ACT nasal spray      Sig: Place 2 sprays into both nostrils at bedtime.    Dispense:  16 g    Refill:  2    I personally performed the services described in this documentation, which was scribed in my presence. The recorded information has been reviewed and considered, and addended by me as needed.   Delman Cheadle, M.D.  Primary Care at [facility] [address] [place], [place] [address] [phone] phone [phone] fax  04/03/16 9:05 PM

## 2016-04-29 ENCOUNTER — Ambulatory Visit (INDEPENDENT_AMBULATORY_CARE_PROVIDER_SITE_OTHER): Payer: BLUE CROSS/BLUE SHIELD | Admitting: Family Medicine

## 2016-04-29 ENCOUNTER — Encounter: Payer: Self-pay | Admitting: Family Medicine

## 2016-04-29 VITALS — BP 136/91 | HR 72 | Temp 97.8°F | Resp 16 | Ht 65.0 in | Wt 176.0 lb

## 2016-04-29 DIAGNOSIS — Z130 Encounter for screening for diseases of the blood and blood-forming organs and certain disorders involving the immune mechanism: Secondary | ICD-10-CM

## 2016-04-29 DIAGNOSIS — Z1211 Encounter for screening for malignant neoplasm of colon: Secondary | ICD-10-CM | POA: Diagnosis not present

## 2016-04-29 DIAGNOSIS — Z1329 Encounter for screening for other suspected endocrine disorder: Secondary | ICD-10-CM | POA: Diagnosis not present

## 2016-04-29 DIAGNOSIS — R1310 Dysphagia, unspecified: Secondary | ICD-10-CM | POA: Diagnosis not present

## 2016-04-29 DIAGNOSIS — Z1383 Encounter for screening for respiratory disorder NEC: Secondary | ICD-10-CM

## 2016-04-29 DIAGNOSIS — Z1389 Encounter for screening for other disorder: Secondary | ICD-10-CM

## 2016-04-29 DIAGNOSIS — Z0000 Encounter for general adult medical examination without abnormal findings: Secondary | ICD-10-CM

## 2016-04-29 DIAGNOSIS — N911 Secondary amenorrhea: Secondary | ICD-10-CM

## 2016-04-29 DIAGNOSIS — K2100 Gastro-esophageal reflux disease with esophagitis, without bleeding: Secondary | ICD-10-CM

## 2016-04-29 DIAGNOSIS — I10 Essential (primary) hypertension: Secondary | ICD-10-CM

## 2016-04-29 DIAGNOSIS — Z1212 Encounter for screening for malignant neoplasm of rectum: Secondary | ICD-10-CM

## 2016-04-29 DIAGNOSIS — R1319 Other dysphagia: Secondary | ICD-10-CM

## 2016-04-29 DIAGNOSIS — Z136 Encounter for screening for cardiovascular disorders: Secondary | ICD-10-CM | POA: Diagnosis not present

## 2016-04-29 DIAGNOSIS — H9311 Tinnitus, right ear: Secondary | ICD-10-CM

## 2016-04-29 DIAGNOSIS — Z113 Encounter for screening for infections with a predominantly sexual mode of transmission: Secondary | ICD-10-CM

## 2016-04-29 LAB — POCT URINALYSIS DIP (MANUAL ENTRY)
BILIRUBIN UA: NEGATIVE mg/dL
Bilirubin, UA: NEGATIVE
GLUCOSE UA: NEGATIVE mg/dL
Leukocytes, UA: NEGATIVE
Nitrite, UA: NEGATIVE
Protein Ur, POC: NEGATIVE mg/dL
SPEC GRAV UA: 1.02 (ref 1.010–1.025)
Urobilinogen, UA: 0.2 E.U./dL
pH, UA: 5.5 (ref 5.0–8.0)

## 2016-04-29 MED ORDER — HYDROCHLOROTHIAZIDE 25 MG PO TABS: 25.0000 mg | ORAL_TABLET | Freq: Every day | ORAL | 2 refills | Status: DC

## 2016-04-29 MED ORDER — ZOSTER VAC RECOMB ADJUVANTED 50 MCG/0.5ML IM SUSR: 0.5000 mL | Freq: Once | INTRAMUSCULAR | 1 refills | Status: AC

## 2016-04-29 MED ORDER — OMEPRAZOLE 40 MG PO CPDR: 40.0000 mg | DELAYED_RELEASE_CAPSULE | Freq: Every day | ORAL | 1 refills | Status: DC

## 2016-04-29 NOTE — Progress Notes (Signed)
Subjective:    Patient ID: Kaylee Lopez, female    DOB: 05-17-1965, 51 y.o.   MRN: [id number] Chief Complaint  Patient presents with  . Annual Exam    pt had pap and mammogram in the fall of 2017 by doc mccomb    HPI  Kaylee Lopez is a delightful 51 yo woman here for her CPE.   Tinnitus: Last visit elev BP.  Right side with occasional sounbds and stabs. No dizziness, lightheadedness. Does notice it more at rest though is not positional and occ is present in the mornings.  HTN: Not able to check outside office. Has started watching salt other than during her cruise but very good over the last 2 weeks.  Primary Preventative Screenings: Cervical Cancer: done 09/2015 - sees gyn Dr. Arvella Nigh annually STI screening: Breast Cancer: done 09/2015 Colorectal Cancer: ok to refer for colonoscopy, no gerd.  Did have some dysphagia and found on a modified barium that was likely due to reflux. She was put on a course of nexium which worked. Now off but sometimes with recurrent sxs Tobacco use: none Bone Density: not on any supplements. Does eat cheese, yogurt Cardiac: Weight/blood sugar: OTC/vit/supp/herbal:  Cause nausea frequently.  Diet/Exercise/EtOH/substances: She is consuming 32 oz bottle of water/daily and drinks plenty of seltzer. She walks her dog for exercise and is trying to get back into running. Pt has noticed that she has gained weight recently. She reports drinking wine occasionally during the week and on the weekends. Dentist/Optho: Immunizations: flu shot UTD  Past Medical History:  Diagnosis Date  . Allergy    History reviewed. No pertinent surgical history. Current Outpatient Prescriptions on File Prior to Visit  Medication Sig Dispense Refill  . fluticasone (FLONASE) 50 MCG/ACT nasal spray Place 2 sprays into both nostrils at bedtime. 16 g 2  . polyethylene glycol powder (GLYCOLAX/MIRALAX) powder Take 17 g by mouth 2 (two) times daily as needed. 3350 g 1   No  current facility-administered medications on file prior to visit.    No Known Allergies Family History  Problem Relation Age of Onset  . Heart disease Mother   . Hypertension Mother   . Hyperlipidemia Father   . Cancer Maternal Grandmother   . Heart disease Maternal Grandfather   . Hypertension Maternal Grandfather   . Stroke Paternal Grandmother    Social History   Social History  . Marital status: Married    Spouse name: N/A  . Number of children: N/A  . Years of education: N/A   Social History Main Topics  . Smoking status: Never Smoker  . Smokeless tobacco: Never Used  . Alcohol use 0.0 oz/week  . Drug use: No  . Sexual activity: Not Asked   Other Topics Concern  . None   Social History Narrative  . None   Depression screen [facility] 2/9 04/29/2016 04/02/2016 08/05/2014 07/20/2014  Decreased Interest 0 0 0 0  Down, Depressed, Hopeless 0 0 0 0  PHQ - 2 Score 0 0 0 0     Review of Systems  HENT: Positive for ear pain, postnasal drip and tinnitus.   Genitourinary: Positive for menstrual problem. Negative for vaginal bleeding.  Allergic/Immunologic: Positive for environmental allergies.  Psychiatric/Behavioral: Positive for decreased concentration. The patient is nervous/anxious.   All other systems reviewed and are negative.  See hpi    Objective:   Physical Exam  Constitutional: She is oriented to person, place, and time. She appears well-developed and well-nourished. No distress.  HENT:  Head: Normocephalic and atraumatic.  Right Ear: Hearing, tympanic membrane, external ear and ear canal normal.  Left Ear: Hearing, tympanic membrane, external ear and ear canal normal.  Nose: Nose normal. No mucosal edema or rhinorrhea.  Mouth/Throat: Uvula is midline, oropharynx is clear and moist and mucous membranes are normal. No posterior oropharyngeal erythema.  Normal AC>BC B on rinne, weber doesn't lateralize  Eyes: Conjunctivae and EOM are normal. Pupils are equal, round,  and reactive to light. Right eye exhibits no discharge. Left eye exhibits no discharge. No scleral icterus.  Neck: Normal range of motion. Neck supple. No thyromegaly present.  Cardiovascular: Normal rate, regular rhythm, normal heart sounds and intact distal pulses.   Pulmonary/Chest: Effort normal and breath sounds normal. No respiratory distress.  Abdominal: Soft. Bowel sounds are normal. There is no tenderness.  Musculoskeletal: She exhibits no edema.  Lymphadenopathy:    She has no cervical adenopathy.  Neurological: She is alert and oriented to person, place, and time. She has normal reflexes.  Skin: Skin is warm and dry. She is not diaphoretic. No erythema.  Psychiatric: She has a normal mood and affect. Her behavior is normal.    Visual Acuity Screening   Right eye Left eye Both eyes  Without correction:     With correction: 20/20 20/30 20/13      BP (!) 136/91   Pulse 72   Temp 97.8 F (36.6 C) (Oral)   Resp 16   Ht 5\' 5"  (1.651 m)   Wt 176 lb (79.8 kg)   SpO2 97%   BMI 29.29 kg/m  BP recheck 130/98    EKG: NSR, no acute ischemic changes Assessment & Plan:   1. Annual physical exam - start ca/vit D supp  2. Routine screening for STI (sexually transmitted infection)   3. Screening for cardiovascular, respiratory, and genitourinary diseases   4. Screening for colorectal cancer - refer for Colonoscopy  5. Screening for deficiency anemia   6. Screening for thyroid disorder   7. Right-sided tinnitus - persists, no improvement with antihistamine, nasal steroid, decrease salt in diet/increased h20 though cont all these changes.  No hearing changes. Refer to ENT for further eval.  8. Essential hypertension - bp still elev so start hctz. rec start monitoring bp at home (cons getting home cuff)  9. Esophageal dysphagia - referred to GI, sounds like may benefit from upper endoscopy at same times as initial colonscopy, cont ppi in the interim  10. Gastroesophageal reflux  disease with esophagitis   11. Amenorrhea, secondary - missed menses x 5 mos, then recurred once so sounds in peri-menopausal period - check gonadotropins to confirm    Orders Placed This Encounter  Procedures  . CBC  . Comprehensive metabolic panel    Order Specific Question:   Has the patient fasted?    Answer:   Yes  . TSH  . Lipid panel    Order Specific Question:   Has the patient fasted?    Answer:   Yes  . VITAMIN D 25 Hydroxy (Vit-D Deficiency, Fractures)  . HIV antibody  . FSH/LH  . Ambulatory referral to Gastroenterology    Referral Priority:   Routine    Referral Type:   Consultation    Referral Reason:   Specialty Services Required    Number of Visits Requested:   1  . Ambulatory referral to ENT    Referral Priority:   Routine    Referral Type:   Consultation  Referral Reason:   Specialty Services Required    Requested Specialty:   Otolaryngology    Number of Visits Requested:   1  . Care order/instruction:    Scheduling Instructions:     Complete orders, AVS and go.  Marland Kitchen POCT urinalysis dipstick  . EKG 12-Lead    Meds ordered this encounter  Medications  . loratadine (CLARITIN) 10 MG tablet    Sig: Take 10 mg by mouth daily.  Marland Kitchen omeprazole (PRILOSEC) 40 MG capsule    Sig: Take 1 capsule (40 mg total) by mouth daily. 30 minutes before a meal    Dispense:  90 capsule    Refill:  1  . Zoster Vac Recomb Adjuvanted [facility]) injection    Sig: Inject 0.5 mLs into the muscle once. Repeat within 2-6 months    Dispense:  0.5 mL    Refill:  1  . hydrochlorothiazide (HYDRODIURIL) 25 MG tablet    Sig: Take 1 tablet (25 mg total) by mouth daily.    Dispense:  30 tablet    Refill:  2     Delman Cheadle, M.D.  Primary Care at [facility] [address] [place], [place] [address] [phone] phone [phone] fax  05/04/16 2:13 PM

## 2016-04-29 NOTE — Patient Instructions (Addendum)
Make sure you are taking a daily calcium/vitamin D supplement - and twice a day would be great. Try to find a chewable calcium CITRATE supplement with 400 to 600 mg of calcium in it and as much vitamin D as you can.  Take this at least once a day, and not with other calcium sources for maximum absorption. Look for an off-brand that is like "Citracal" or "Caltrate" which you will be able to absorb better that the calcium CARBONATE products while you are on medicines for acid reflux.    IF you received an x-ray today, you will receive an invoice from [facility]. Please contact [facility] at [phone] with questions or concerns regarding your invoice.   IF you received labwork today, you will receive an invoice from [facility]. Please contact [facility] at [phone] with questions or concerns regarding your invoice.   Our billing staff will not be able to assist you with questions regarding bills from these companies.  You will be contacted with the lab results as soon as they are available. The fastest way to get your results is to activate your My Chart account. Instructions are located on the last page of this paperwork. If you have not heard from [place] regarding the results in 2 weeks, please contact this office.       Health Maintenance for Postmenopausal Women Menopause is a normal process in which your reproductive ability comes to an end. This process happens gradually over a span of months to years, usually between the ages of 35 and 17. Menopause is complete when you have missed 12 consecutive menstrual periods. It is important to talk with your health care provider about some of the most common conditions that affect postmenopausal women, such as heart disease, cancer, and bone loss (osteoporosis). Adopting a healthy lifestyle and getting preventive care can help to promote your health and wellness. Those actions can also lower your chances of developing some of these  common conditions. What should I know about menopause? During menopause, you may experience a number of symptoms, such as:  Moderate-to-severe hot flashes.  Night sweats.  Decrease in sex drive.  Mood swings.  Headaches.  Tiredness.  Irritability.  Memory problems.  Insomnia. Choosing to treat or not to treat menopausal changes is an individual decision that you make with your health care provider. What should I know about hormone replacement therapy and supplements? Hormone therapy products are effective for treating symptoms that are associated with menopause, such as hot flashes and night sweats. Hormone replacement carries certain risks, especially as you become older. If you are thinking about using estrogen or estrogen with progestin treatments, discuss the benefits and risks with your health care provider. What should I know about heart disease and stroke? Heart disease, heart attack, and stroke become more likely as you age. This may be due, in part, to the hormonal changes that your body experiences during menopause. These can affect how your body processes dietary fats, triglycerides, and cholesterol. Heart attack and stroke are both medical emergencies. There are many things that you can do to help prevent heart disease and stroke:  Have your blood pressure checked at least every 1-2 years. High blood pressure causes heart disease and increases the risk of stroke.  If you are 17-73 years old, ask your health care provider if you should take aspirin to prevent a heart attack or a stroke.  Do not use any tobacco products, including cigarettes, chewing tobacco, or electronic cigarettes. If you  need help quitting, ask your health care provider.  It is important to eat a healthy diet and maintain a healthy weight.  Be sure to include plenty of vegetables, fruits, low-fat dairy products, and lean protein.  Avoid eating foods that are high in solid fats, added sugars, or  salt (sodium).  Get regular exercise. This is one of the most important things that you can do for your health.  Try to exercise for at least 150 minutes each week. The type of exercise that you do should increase your heart rate and make you sweat. This is known as moderate-intensity exercise.  Try to do strengthening exercises at least twice each week. Do these in addition to the moderate-intensity exercise.  Know your numbers.Ask your health care provider to check your cholesterol and your blood glucose. Continue to have your blood tested as directed by your health care provider. What should I know about cancer screening? There are several types of cancer. Take the following steps to reduce your risk and to catch any cancer development as early as possible. Breast Cancer  Practice breast self-awareness.  This means understanding how your breasts normally appear and feel.  It also means doing regular breast self-exams. Let your health care provider know about any changes, no matter how small.  If you are 7 or older, have a clinician do a breast exam (clinical breast exam or CBE) every year. Depending on your age, family history, and medical history, it may be recommended that you also have a yearly breast X-ray (mammogram).  If you have a family history of breast cancer, talk with your health care provider about genetic screening.  If you are at high risk for breast cancer, talk with your health care provider about having an MRI and a mammogram every year.  Breast cancer (BRCA) gene test is recommended for women who have family members with BRCA-related cancers. Results of the assessment will determine the need for genetic counseling and BRCA1 and for BRCA2 testing. BRCA-related cancers include these types:  Breast. This occurs in males or females.  Ovarian.  Tubal. This may also be called fallopian tube cancer.  Cancer of the abdominal or pelvic lining (peritoneal  cancer).  Prostate.  Pancreatic. Cervical, Uterine, and Ovarian Cancer  Your health care provider may recommend that you be screened regularly for cancer of the pelvic organs. These include your ovaries, uterus, and vagina. This screening involves a pelvic exam, which includes checking for microscopic changes to the surface of your cervix (Pap test).  For women ages 21-65, health care providers may recommend a pelvic exam and a Pap test every three years. For women ages 31-65, they may recommend the Pap test and pelvic exam, combined with testing for human papilloma virus (HPV), every five years. Some types of HPV increase your risk of cervical cancer. Testing for HPV may also be done on women of any age who have unclear Pap test results.  Other health care providers may not recommend any screening for nonpregnant women who are considered low risk for pelvic cancer and have no symptoms. Ask your health care provider if a screening pelvic exam is right for you.  If you have had past treatment for cervical cancer or a condition that could lead to cancer, you need Pap tests and screening for cancer for at least 20 years after your treatment. If Pap tests have been discontinued for you, your risk factors (such as having a new sexual partner) need to be reassessed  to determine if you should start having screenings again. Some women have medical problems that increase the chance of getting cervical cancer. In these cases, your health care provider may recommend that you have screening and Pap tests more often.  If you have a family history of uterine cancer or ovarian cancer, talk with your health care provider about genetic screening.  If you have vaginal bleeding after reaching menopause, tell your health care provider.  There are currently no reliable tests available to screen for ovarian cancer. Lung Cancer  Lung cancer screening is recommended for adults 38-71 years old who are at high risk for  lung cancer because of a history of smoking. A yearly low-dose CT scan of the lungs is recommended if you:  Currently smoke.  Have a history of at least 30 pack-years of smoking and you currently smoke or have quit within the past 15 years. A pack-year is smoking an average of one pack of cigarettes per day for one year. Yearly screening should:  Continue until it has been 15 years since you quit.  Stop if you develop a health problem that would prevent you from having lung cancer treatment. Colorectal Cancer  This type of cancer can be detected and can often be prevented.  Routine colorectal cancer screening usually begins at age 24 and continues through age 68.  If you have risk factors for colon cancer, your health care provider may recommend that you be screened at an earlier age.  If you have a family history of colorectal cancer, talk with your health care provider about genetic screening.  Your health care provider may also recommend using home test kits to check for hidden blood in your stool.  A small camera at the end of a tube can be used to examine your colon directly (sigmoidoscopy or colonoscopy). This is done to check for the earliest forms of colorectal cancer.  Direct examination of the colon should be repeated every 5-10 years until age 36. However, if early forms of precancerous polyps or small growths are found or if you have a family history or genetic risk for colorectal cancer, you may need to be screened more often. Skin Cancer  Check your skin from head to toe regularly.  Monitor any moles. Be sure to tell your health care provider:  About any new moles or changes in moles, especially if there is a change in a mole's shape or color.  If you have a mole that is larger than the size of a pencil eraser.  If any of your family members has a history of skin cancer, especially at a young age, talk with your health care provider about genetic screening.  Always  use sunscreen. Apply sunscreen liberally and repeatedly throughout the day.  Whenever you are outside, protect yourself by wearing long sleeves, pants, a wide-brimmed hat, and sunglasses. What should I know about osteoporosis? Osteoporosis is a condition in which bone destruction happens more quickly than new bone creation. After menopause, you may be at an increased risk for osteoporosis. To help prevent osteoporosis or the bone fractures that can happen because of osteoporosis, the following is recommended:  If you are 62-27 years old, get at least 1,000 mg of calcium and at least 600 mg of vitamin D per day.  If you are older than age 8 but younger than age 78, get at least 1,200 mg of calcium and at least 600 mg of vitamin D per day.  If you are  older than age 80, get at least 1,200 mg of calcium and at least 800 mg of vitamin D per day. Smoking and excessive alcohol intake increase the risk of osteoporosis. Eat foods that are rich in calcium and vitamin D, and do weight-bearing exercises several times each week as directed by your health care provider. What should I know about how menopause affects my mental health? Depression may occur at any age, but it is more common as you become older. Common symptoms of depression include:  Low or sad mood.  Changes in sleep patterns.  Changes in appetite or eating patterns.  Feeling an overall lack of motivation or enjoyment of activities that you previously enjoyed.  Frequent crying spells. Talk with your health care provider if you think that you are experiencing depression. What should I know about immunizations? It is important that you get and maintain your immunizations. These include:  Tetanus, diphtheria, and pertussis (Tdap) booster vaccine.  Influenza every year before the flu season begins.  Pneumonia vaccine.  Shingles vaccine. Your health care provider may also recommend other immunizations. This information is not  intended to replace advice given to you by your health care provider. Make sure you discuss any questions you have with your health care provider. Document Released: 02/19/2005 Document Revised: 07/18/2015 Document Reviewed: 10/01/2014 Elsevier Interactive Patient Education  2017 [employer].

## 2016-04-30 LAB — COMPREHENSIVE METABOLIC PANEL
A/G RATIO: 1.8 (ref 1.2–2.2)
ALBUMIN: 4.6 g/dL (ref 3.5–5.5)
ALT: 97 IU/L — AB (ref 0–32)
AST: 73 IU/L — ABNORMAL HIGH (ref 0–40)
Alkaline Phosphatase: 60 IU/L (ref 39–117)
BILIRUBIN TOTAL: 1 mg/dL (ref 0.0–1.2)
BUN / CREAT RATIO: 20 (ref 9–23)
BUN: 13 mg/dL (ref 6–24)
CALCIUM: 9.3 mg/dL (ref 8.7–10.2)
CHLORIDE: 100 mmol/L (ref 96–106)
CO2: 24 mmol/L (ref 18–29)
Creatinine, Ser: 0.66 mg/dL (ref 0.57–1.00)
GFR, EST AFRICAN AMERICAN: 119 mL/min/{1.73_m2} (ref >59)
GFR, EST NON AFRICAN AMERICAN: 103 mL/min/{1.73_m2} (ref >59)
Globulin, Total: 2.6 g/dL (ref 1.5–4.5)
Glucose: 94 mg/dL (ref 65–99)
POTASSIUM: 4.1 mmol/L (ref 3.5–5.2)
Sodium: 141 mmol/L (ref 134–144)
TOTAL PROTEIN: 7.2 g/dL (ref 6.0–8.5)

## 2016-04-30 LAB — CBC
HEMATOCRIT: 42.2 % (ref 34.0–46.6)
HEMOGLOBIN: 14.1 g/dL (ref 11.1–15.9)
MCH: 31.3 pg (ref 26.6–33.0)
MCHC: 33.4 g/dL (ref 31.5–35.7)
MCV: 94 fL (ref 79–97)
PLATELETS: 260 10*3/uL (ref 150–379)
RBC: 4.51 x10E6/uL (ref 3.77–5.28)
RDW: 12.7 % (ref 12.3–15.4)
WBC: 6.2 10*3/uL (ref 3.4–10.8)

## 2016-04-30 LAB — VITAMIN D 25 HYDROXY (VIT D DEFICIENCY, FRACTURES): VIT D 25 HYDROXY: 29.8 ng/mL — AB (ref 30.0–100.0)

## 2016-04-30 LAB — TSH: TSH: 1.56 u[IU]/mL (ref 0.450–4.500)

## 2016-04-30 LAB — HIV ANTIBODY (ROUTINE TESTING W REFLEX): HIV Screen 4th Generation wRfx: NONREACTIVE

## 2016-04-30 LAB — LIPID PANEL
CHOL/HDL RATIO: 4.4 ratio (ref 0.0–4.4)
Cholesterol, Total: 235 mg/dL — ABNORMAL HIGH (ref 100–199)
HDL: 53 mg/dL (ref >39)
LDL CALC: 153 mg/dL — AB (ref 0–99)
TRIGLYCERIDES: 145 mg/dL (ref 0–149)
VLDL Cholesterol Cal: 29 mg/dL (ref 5–40)

## 2016-05-03 ENCOUNTER — Encounter: Payer: Self-pay | Admitting: Gastroenterology

## 2016-05-05 LAB — FSH/LH

## 2016-05-06 LAB — SPECIMEN STATUS REPORT

## 2016-05-06 LAB — FSH/LH
FSH: 50.7 m[IU]/mL
LH: 35.5 m[IU]/mL

## 2016-06-01 ENCOUNTER — Ambulatory Visit (INDEPENDENT_AMBULATORY_CARE_PROVIDER_SITE_OTHER): Payer: BLUE CROSS/BLUE SHIELD | Admitting: Gastroenterology

## 2016-06-01 ENCOUNTER — Encounter: Payer: Self-pay | Admitting: Gastroenterology

## 2016-06-01 VITALS — BP 130/84 | HR 68 | Ht 64.57 in | Wt 177.4 lb

## 2016-06-01 DIAGNOSIS — R1310 Dysphagia, unspecified: Secondary | ICD-10-CM | POA: Diagnosis not present

## 2016-06-01 DIAGNOSIS — Z1211 Encounter for screening for malignant neoplasm of colon: Secondary | ICD-10-CM

## 2016-06-01 DIAGNOSIS — Z1212 Encounter for screening for malignant neoplasm of rectum: Secondary | ICD-10-CM | POA: Diagnosis not present

## 2016-06-01 MED ORDER — NA SULFATE-K SULFATE-MG SULF 17.5-3.13-1.6 GM/177ML PO SOLN: 1.0000 | Freq: Once | ORAL | 0 refills | Status: AC

## 2016-06-01 NOTE — Patient Instructions (Signed)
You have been scheduled for an endoscopy and colonoscopy. Please follow the written instructions given to you at your visit today. Please pick up your prep supplies at the pharmacy within the next 1-3 days. If you use inhalers (even only as needed), please bring them with you on the day of your procedure. Your physician has requested that you go to www.startemmi.com and enter the access code given to you at your visit today. This web site gives a general overview about your procedure. However, you should still follow specific instructions given to you by our office regarding your preparation for the procedure.  Thank you for choosing me and [facility].  Malcolm T. Stark, Jr., MD., FACG  

## 2016-06-01 NOTE — Progress Notes (Signed)
History of Present Illness: This is a 51 year old female referred by Shawnee Knapp, MD for the evaluation of dysphagia and CRC screening. Patient relates a 2 year history of intermittent solid food dysphagia mainly to meats, breads and raw carrots. Symptoms occur a few times per month on average. She was placed on omeprazole several weeks ago by Dr. Brigitte Pulse and she has had symptoms less frequently. She does not note any heartburn or regurgitation. She has no other gastrointestinal complaints. She is due for colorectal cancer screening. Denies weight loss, abdominal pain, constipation, diarrhea, change in stool caliber, melena, hematochezia, nausea, vomiting, reflux symptoms, chest pain.    No Known Allergies Outpatient Medications Prior to Visit  Medication Sig Dispense Refill  . fluticasone (FLONASE) 50 MCG/ACT nasal spray Place 2 sprays into both nostrils at bedtime. 16 g 2  . hydrochlorothiazide (HYDRODIURIL) 25 MG tablet Take 1 tablet (25 mg total) by mouth daily. 30 tablet 2  . loratadine (CLARITIN) 10 MG tablet Take 10 mg by mouth daily.    Marland Kitchen omeprazole (PRILOSEC) 40 MG capsule Take 1 capsule (40 mg total) by mouth daily. 30 minutes before a meal 90 capsule 1  . polyethylene glycol powder (GLYCOLAX/MIRALAX) powder Take 17 g by mouth 2 (two) times daily as needed. 3350 g 1   No facility-administered medications prior to visit.    Past Medical History:  Diagnosis Date  . Allergy   . Anxiety   . Bacterial meningitis   . GERD (gastroesophageal reflux disease)   . HTN (hypertension)    Past Surgical History:  Procedure Laterality Date  . NO PAST SURGERIES     Social History   Social History  . Marital status: Married    Spouse name: N/A  . Number of children: 2  . Years of education: N/A   Occupational History  . physical therapist    Social History Main Topics  . Smoking status: Never Smoker  . Smokeless tobacco: Never Used  . Alcohol use 0.0 oz/week     Comment: 2-3 per  day  . Drug use: No  . Sexual activity: Not Asked   Other Topics Concern  . None   Social History Narrative  . None   Family History  Problem Relation Age of Onset  . Heart disease Mother   . Hypertension Mother   . Hyperlipidemia Father   . Prostate cancer Father   . Colon polyps Father        Pro b n peptide 281  . Breast cancer Maternal Grandmother   . Heart disease Maternal Grandfather   . Hypertension Maternal Grandfather   . Kidney disease Maternal Grandfather   . Stroke Paternal Grandmother   . Breast cancer Paternal Aunt   . Breast cancer Paternal Aunt   . Kidney disease Maternal Aunt   . Diabetes Maternal Aunt       Review of Systems: Pertinent positive and negative review of systems were noted in the above HPI section. All other review of systems were otherwise negative.    Physical Exam: General: Well developed, well nourished, no acute distress Head: Normocephalic and atraumatic Eyes:  sclerae anicteric, EOMI Ears: Normal auditory acuity Mouth: No deformity or lesions Neck: Supple, no masses or thyromegaly Lungs: Clear throughout to auscultation Heart: Regular rate and rhythm; no murmurs, rubs or bruits Abdomen: Soft, non tender and non distended. No masses, hepatosplenomegaly or hernias noted. Normal Bowel sounds Rectal: deferred to colonoscopy Musculoskeletal: Symmetrical with no gross  deformities  Skin: No lesions on visible extremities Pulses:  Normal pulses noted Extremities: No clubbing, cyanosis, edema or deformities noted Neurological: Alert oriented x 4, grossly nonfocal Cervical Nodes:  No significant cervical adenopathy Inguinal Nodes: No significant inguinal adenopathy Psychological:  Alert and cooperative. Normal mood and affect  Assessment and Recommendations:  1. Dysphagia. Suspected esophageal stricture. Possible silent GERD. Continue omeprazole 40 mg daily. Follow antireflux measures. Avoid meats breads and raw vegetables for now.  Schedule EGD with possible dilation. The risks (including bleeding, perforation, infection, missed lesions, medication reactions and possible hospitalization or surgery if complications occur), benefits, and alternatives to endoscopy with possible biopsy and possible dilation were discussed with the patient and they consent to proceed.   2. CRC screening, average risk. Schedule colonoscopy. The risks (including bleeding, perforation, infection, missed lesions, medication reactions and possible hospitalization or surgery if complications occur), benefits, and alternatives to colonoscopy with possible biopsy and possible polypectomy were discussed with the patient and they consent to proceed.    cc: Shawnee Knapp, MD [address] [place], [place] [address]

## 2016-06-14 ENCOUNTER — Other Ambulatory Visit: Payer: Self-pay | Admitting: Obstetrics and Gynecology

## 2016-06-14 DIAGNOSIS — Z803 Family history of malignant neoplasm of breast: Secondary | ICD-10-CM

## 2016-06-18 ENCOUNTER — Other Ambulatory Visit: Payer: Self-pay | Admitting: Family Medicine

## 2016-07-07 ENCOUNTER — Encounter: Payer: Self-pay | Admitting: Gastroenterology

## 2016-07-18 ENCOUNTER — Other Ambulatory Visit: Payer: Self-pay | Admitting: Family Medicine

## 2016-07-21 ENCOUNTER — Encounter: Payer: Self-pay | Admitting: Gastroenterology

## 2016-07-21 ENCOUNTER — Ambulatory Visit (AMBULATORY_SURGERY_CENTER): Payer: BLUE CROSS/BLUE SHIELD | Admitting: Gastroenterology

## 2016-07-21 VITALS — BP 121/77 | HR 77 | Temp 99.1°F | Resp 18 | Ht 64.0 in | Wt 177.0 lb

## 2016-07-21 DIAGNOSIS — K317 Polyp of stomach and duodenum: Secondary | ICD-10-CM

## 2016-07-21 DIAGNOSIS — K222 Esophageal obstruction: Secondary | ICD-10-CM

## 2016-07-21 DIAGNOSIS — R1310 Dysphagia, unspecified: Secondary | ICD-10-CM | POA: Diagnosis not present

## 2016-07-21 DIAGNOSIS — D124 Benign neoplasm of descending colon: Secondary | ICD-10-CM

## 2016-07-21 DIAGNOSIS — K635 Polyp of colon: Secondary | ICD-10-CM | POA: Diagnosis not present

## 2016-07-21 DIAGNOSIS — D126 Benign neoplasm of colon, unspecified: Secondary | ICD-10-CM

## 2016-07-21 DIAGNOSIS — Z1212 Encounter for screening for malignant neoplasm of rectum: Secondary | ICD-10-CM

## 2016-07-21 DIAGNOSIS — D123 Benign neoplasm of transverse colon: Secondary | ICD-10-CM

## 2016-07-21 DIAGNOSIS — Z1211 Encounter for screening for malignant neoplasm of colon: Secondary | ICD-10-CM | POA: Diagnosis present

## 2016-07-21 MED ORDER — SODIUM CHLORIDE 0.9 % IV SOLN: 500.0000 mL | INTRAVENOUS | Status: DC

## 2016-07-21 NOTE — Op Note (Signed)
Interior Patient Name: Kaylee Lopez Procedure Date: 07/21/2016 2:14 PM MRN: [id number] Endoscopist: Ladene Artist , MD Age: 51 Referring MD:  Date of Birth: Oct 17, 1965 Gender: Female Account #: [id number] Procedure:                Colonoscopy Indications:              Screening for colorectal malignant neoplasm, This                            is the patient's first colonoscopy Medicines:                Monitored Anesthesia Care Procedure:                Pre-Anesthesia Assessment:                           - Prior to the procedure, a History and Physical                            was performed, and patient medications and                            allergies were reviewed. The patient's tolerance of                            previous anesthesia was also reviewed. The risks                            and benefits of the procedure and the sedation                            options and risks were discussed with the patient.                            All questions were answered, and informed consent                            was obtained. Prior Anticoagulants: The patient has                            taken no previous anticoagulant or antiplatelet                            agents. ASA Grade Assessment: II - A patient with                            mild systemic disease. After reviewing the risks                            and benefits, the patient was deemed in                            satisfactory condition to undergo the procedure.  After obtaining informed consent, the colonoscope                            was passed under direct vision. Throughout the                            procedure, the patient's blood pressure, pulse, and                            oxygen saturations were monitored continuously. The                            Model PCF-[id number]) scope was introduced                            through the anus  and advanced to the the cecum,                            identified by appendiceal orifice and ileocecal                            valve. The ileocecal valve, appendiceal orifice,                            and rectum were photographed. The quality of the                            bowel preparation was excellent. The colonoscopy                            was performed without difficulty. The patient                            tolerated the procedure well. Scope In: 2:18:44 PM Scope Out: 2:31:28 PM Scope Withdrawal Time: 0 hours 10 minutes 54 seconds  Total Procedure Duration: 0 hours 12 minutes 44 seconds  Findings:                 The perianal and digital rectal examinations were                            normal.                           Four sessile polyps were found in the descending                            colon, transverse colon (2) and hepatic flexure.                            The polyps were 5 to 7 mm in size. These polyps                            were removed with a cold snare. Resection and  retrieval were complete.                           The exam was otherwise without abnormality on                            direct and retroflexion views. Complications:            No immediate complications. Estimated blood loss:                            None. Estimated Blood Loss:     Estimated blood loss: none. Impression:               - Four 5 to 7 mm polyps in the descending colon, in                            the transverse colon and at the hepatic flexure,                            removed with a cold snare. Resected and retrieved.                           - Hemorrhoids.                           - The examination was otherwise normal on direct                            and retroflexion views. Recommendation:           - Repeat colonoscopy in 5 years for surveillance if                            polyp(s) are precancerous, otherwise 10  years.                           - Patient has a contact number available for                            emergencies. The signs and symptoms of potential                            delayed complications were discussed with the                            patient. Return to normal activities tomorrow.                            Written discharge instructions were provided to the                            patient.                           - Resume previous diet.                           -  Continue present medications.                           - Await pathology results. Ladene Artist, MD 07/21/2016 2:34:32 PM This report has been signed electronically.

## 2016-07-21 NOTE — Progress Notes (Signed)
Report to PACU, RN, vss, BBS= Clear.  

## 2016-07-21 NOTE — Patient Instructions (Signed)
HANDOUTS IVEN:ESOPHAGEAL DILATATION DIET, GERD, ESOPHAGEAL STRICTURE AND POLYPS.  YOU HAD AN ENDOSCOPIC PROCEDURE TODAY AT [facility]:   Refer to the procedure report that was given to you for any specific questions about what was found during the examination.  If the procedure report does not answer your questions, please call your gastroenterologist to clarify.  If you requested that your care partner not be given the details of your procedure findings, then the procedure report has been included in a sealed envelope for you to review at your convenience later.  YOU SHOULD EXPECT: Some feelings of bloating in the abdomen. Passage of more gas than usual.  Walking can help get rid of the air that was put into your GI tract during the procedure and reduce the bloating. If you had a lower endoscopy (such as a colonoscopy or flexible sigmoidoscopy) you may notice spotting of blood in your stool or on the toilet paper. If you underwent a bowel prep for your procedure, you may not have a normal bowel movement for a few days.  Please Note:  You might notice some irritation and congestion in your nose or some drainage.  This is from the oxygen used during your procedure.  There is no need for concern and it should clear up in a day or so.  SYMPTOMS TO REPORT IMMEDIATELY:   Following lower endoscopy (colonoscopy or flexible sigmoidoscopy):  Excessive amounts of blood in the stool  Significant tenderness or worsening of abdominal pains  Swelling of the abdomen that is new, acute  Fever of 100F or higher   Following upper endoscopy (EGD)  Vomiting of blood or coffee ground material  New chest pain or pain under the shoulder blades  Painful or persistently difficult swallowing  New shortness of breath  Fever of 100F or higher  Black, tarry-looking stools  For urgent or emergent issues, a gastroenterologist can be reached at any hour by calling [phone].   DIET:  CLEAR  LIQUIDS UNTIL 4:30 PM TODAY. AFTER 4:30 ONLY SOFT FOODS UNTIL MORNING. RESUME YOUR REGULAR DIET IN THE AM.  ACTIVITY:  You should plan to take it easy for the rest of today and you should NOT DRIVE or use heavy machinery until tomorrow (because of the sedation medicines used during the test).    FOLLOW UP: Our staff will call the number listed on your records the next business day following your procedure to check on you and address any questions or concerns that you may have regarding the information given to you following your procedure. If we do not reach you, we will leave a message.  However, if you are feeling well and you are not experiencing any problems, there is no need to return our call.  We will assume that you have returned to your regular daily activities without incident.  If any biopsies were taken you will be contacted by phone or by letter within the next 1-3 weeks.  Please call us at [phone] if you have not heard about the biopsies in 3 weeks.    SIGNATURES/CONFIDENTIALITY: You and/or your care partner have signed paperwork which will be entered into your electronic medical record.  These signatures attest to the fact that that the information above on your After Visit Summary has been reviewed and is understood.  Full responsibility of the confidentiality of this discharge information lies with you and/or your care-partner.

## 2016-07-21 NOTE — Op Note (Signed)
[name] Procedure Date: 07/21/2016 2:13 PM MRN: [id number] Endoscopist: Ladene Artist , MD Age: 51 Referring MD:  Date of Birth: [birth year] Gender: Female Account #: [id number] Procedure:                Upper GI endoscopy Indications:              Dysphagia Medicines:                Monitored Anesthesia Care Procedure:                Pre-Anesthesia Assessment:                           - Prior to the procedure, a History and Physical                            was performed, and patient medications and                            allergies were reviewed. The patient's tolerance of                            previous anesthesia was also reviewed. The risks                            and benefits of the procedure and the sedation                            options and risks were discussed with the patient.                            All questions were answered, and informed consent                            was obtained. Prior Anticoagulants: The patient has                            taken no previous anticoagulant or antiplatelet                            agents. ASA Grade Assessment: II - A patient with                            mild systemic disease. After reviewing the risks                            and benefits, the patient was deemed in                            satisfactory condition to undergo the procedure.                           After obtaining informed consent, the endoscope was  passed under direct vision. Throughout the                            procedure, the patient's blood pressure, pulse, and                            oxygen saturations were monitored continuously. The                            Endoscope was introduced through the mouth, and                            advanced to the second part of duodenum. The upper                            GI endoscopy was accomplished without  difficulty.                            The patient tolerated the procedure well. Scope In: Scope Out: Findings:                 One mild benign-appearing, intrinsic stenosis was                            found at the gastroesophageal junction. This                            measured 1.3 cm (inner diameter) and was traversed.                            A guidewire was placed and the scope was withdrawn.                            Dilations were performed with Savary dilators with                            mild resistance at 14 mm and 15 mm. No heme noted.                           The exam of the esophagus was otherwise normal.                           A few 3 to 4 mm sessile polyps with no bleeding and                            no stigmata of recent bleeding were found in the                            gastric fundus and in the gastric body. Biopsies                            were taken with a cold forceps for histology.  The exam of the stomach was otherwise normal.                           The duodenal bulb and second portion of the                            duodenum were normal. Complications:            No immediate complications. Estimated Blood Loss:     Estimated blood loss was minimal. Impression:               - Benign-appearing esophageal stenosis. Dilated.                           - A few gastric polyps. Biopsied.                           - Normal duodenal bulb and second portion of the                            duodenum. Recommendation:           - Clear liquid diet for 2 hours, then advance as                            tolerated to soft diet today. Resume prior diet                            tomorrow.                           - Continue present medications.                           - Await pathology results. Ladene Artist, MD 07/21/2016 2:44:51 PM This report has been signed electronically.

## 2016-07-21 NOTE — Progress Notes (Signed)
Called to room to assist during endoscopic procedure.  Patient ID and intended procedure confirmed with present staff. Received instructions for my participation in the procedure from the performing physician.  

## 2016-07-22 ENCOUNTER — Telehealth: Payer: Self-pay | Admitting: *Deleted

## 2016-07-22 NOTE — Telephone Encounter (Signed)
  Follow up Call-  Call back number 07/21/2016  Post procedure Call Back phone  # [phone]  Permission to leave phone message Yes  Some recent data might be hidden     Patient questions:  Do you have a fever, pain , or abdominal swelling? No. Pain Score  0 *  Have you tolerated food without any problems? Yes.    Have you been able to return to your normal activities? Yes.    Do you have any questions about your discharge instructions: Diet   No. Medications  No. Follow up visit  No.  Do you have questions or concerns about your Care? No.  Actions: * If pain score is 4 or above: No action needed, pain <4.

## 2016-07-26 ENCOUNTER — Ambulatory Visit
Admission: RE | Admit: 2016-07-26 | Discharge: 2016-07-26 | Disposition: A | Discharge status: Home / Self Care | Payer: BLUE CROSS/BLUE SHIELD | Source: Ambulatory Visit | Attending: Obstetrics and Gynecology | Admitting: Obstetrics and Gynecology

## 2016-07-26 ENCOUNTER — Other Ambulatory Visit: Payer: BLUE CROSS/BLUE SHIELD

## 2016-07-26 DIAGNOSIS — Z803 Family history of malignant neoplasm of breast: Secondary | ICD-10-CM

## 2016-07-26 MED ORDER — GADOBENATE DIMEGLUMINE 529 MG/ML IV SOLN
16.0000 mL | Freq: Once | INTRAVENOUS | Status: AC | PRN
  Administered 2016-07-26: 16 mL via INTRAVENOUS

## 2016-08-02 ENCOUNTER — Encounter: Payer: Self-pay | Admitting: Gastroenterology

## 2016-08-15 ENCOUNTER — Other Ambulatory Visit: Payer: Self-pay | Admitting: Family Medicine

## 2016-08-16 NOTE — Telephone Encounter (Signed)
Pt has an apt on 08/18/16. Enough medication to get through apt.

## 2016-08-18 ENCOUNTER — Ambulatory Visit (INDEPENDENT_AMBULATORY_CARE_PROVIDER_SITE_OTHER): Payer: BLUE CROSS/BLUE SHIELD | Admitting: Family Medicine

## 2016-08-18 ENCOUNTER — Encounter: Payer: Self-pay | Admitting: Family Medicine

## 2016-08-18 VITALS — BP 130/84 | HR 93 | Temp 98.1°F | Resp 18 | Ht 64.0 in | Wt 176.2 lb

## 2016-08-18 DIAGNOSIS — R7401 Elevation of levels of liver transaminase levels: Secondary | ICD-10-CM

## 2016-08-18 DIAGNOSIS — Z5181 Encounter for therapeutic drug level monitoring: Secondary | ICD-10-CM

## 2016-08-18 DIAGNOSIS — N951 Menopausal and female climacteric states: Secondary | ICD-10-CM | POA: Diagnosis not present

## 2016-08-18 DIAGNOSIS — R740 Nonspecific elevation of levels of transaminase and lactic acid dehydrogenase [LDH]: Secondary | ICD-10-CM

## 2016-08-18 DIAGNOSIS — I10 Essential (primary) hypertension: Secondary | ICD-10-CM | POA: Diagnosis not present

## 2016-08-18 MED ORDER — HYDROCHLOROTHIAZIDE 25 MG PO TABS: ORAL_TABLET | ORAL | 3 refills | Status: DC

## 2016-08-18 NOTE — Patient Instructions (Addendum)
   IF you received an x-ray today, you will receive an invoice from [facility]. Please contact [facility] at [phone] with questions or concerns regarding your invoice.   IF you received labwork today, you will receive an invoice from [facility]. Please contact [facility] at [phone] with questions or concerns regarding your invoice.   Our billing staff will not be able to assist you with questions regarding bills from these companies.  You will be contacted with the lab results as soon as they are available. The fastest way to get your results is to activate your My Chart account. Instructions are located on the last page of this paperwork. If you have not heard from us regarding the results in 2 weeks, please contact this office.     DASH Eating Plan DASH stands for "Dietary Approaches to Stop Hypertension." The DASH eating plan is a healthy eating plan that has been shown to reduce high blood pressure (hypertension). It may also reduce your risk for type 2 diabetes, heart disease, and stroke. The DASH eating plan may also help with weight loss. What are tips for following this plan? General guidelines   Avoid eating more than 2,300 mg (milligrams) of salt (sodium) a day. If you have hypertension, you may need to reduce your sodium intake to 1,500 mg a day.  Limit alcohol intake to no more than 1 drink a day for nonpregnant women and 2 drinks a day for men. One drink equals 12 oz of beer, 5 oz of wine, or 1 oz of hard liquor.  Work with your health care provider to maintain a healthy body weight or to lose weight. Ask what an ideal weight is for you.  Get at least 30 minutes of exercise that causes your heart to beat faster (aerobic exercise) most days of the week. Activities may include walking, swimming, or biking.  Work with your health care provider or diet and nutrition specialist (dietitian) to adjust your eating plan to your individual calorie  needs. Reading food labels   Check food labels for the amount of sodium per serving. Choose foods with less than 5 percent of the Daily Value of sodium. Generally, foods with less than 300 mg of sodium per serving fit into this eating plan.  To find whole grains, look for the word "whole" as the first word in the ingredient list. Shopping   Buy products labeled as "low-sodium" or "no salt added."  Buy fresh foods. Avoid canned foods and premade or frozen meals. Cooking   Avoid adding salt when cooking. Use salt-free seasonings or herbs instead of table salt or sea salt. Check with your health care provider or pharmacist before using salt substitutes.  Do not fry foods. Cook foods using healthy methods such as baking, boiling, grilling, and broiling instead.  Cook with heart-healthy oils, such as olive, canola, soybean, or sunflower oil. Meal planning    Eat a balanced diet that includes:  5 or more servings of fruits and vegetables each day. At each meal, try to fill half of your plate with fruits and vegetables.  Up to 6-8 servings of whole grains each day.  Less than 6 oz of lean meat, poultry, or fish each day. A 3-oz serving of meat is about the same size as a deck of cards. One egg equals 1 oz.  2 servings of low-fat dairy each day.  A serving of nuts, seeds, or beans 5 times each week.  Heart-healthy fats. Healthy fats called   Omega-3 fatty acids are found in foods such as flaxseeds and coldwater fish, like sardines, salmon, and mackerel.  Limit how much you eat of the following:  Canned or prepackaged foods.  Food that is high in trans fat, such as fried foods.  Food that is high in saturated fat, such as fatty meat.  Sweets, desserts, sugary drinks, and other foods with added sugar.  Full-fat dairy products.  Do not salt foods before eating.  Try to eat at least 2 vegetarian meals each week.  Eat more home-cooked food and less restaurant, buffet, and fast  food.  When eating at a restaurant, ask that your food be prepared with less salt or no salt, if possible. What foods are recommended? The items listed may not be a complete list. Talk with your dietitian about what dietary choices are best for you. Grains  Whole-grain or whole-wheat bread. Whole-grain or whole-wheat pasta. Brown rice. Oatmeal. Quinoa. Bulgur. Whole-grain and low-sodium cereals. Pita bread. Low-fat, low-sodium crackers. Whole-wheat flour tortillas. Vegetables  Fresh or frozen vegetables (raw, steamed, roasted, or grilled). Low-sodium or reduced-sodium tomato and vegetable juice. Low-sodium or reduced-sodium tomato sauce and tomato paste. Low-sodium or reduced-sodium canned vegetables. Fruits  All fresh, dried, or frozen fruit. Canned fruit in natural juice (without added sugar). Meat and other protein foods  Skinless chicken or turkey. Ground chicken or turkey. Pork with fat trimmed off. Fish and seafood. Egg whites. Dried beans, peas, or lentils. Unsalted nuts, nut butters, and seeds. Unsalted canned beans. Lean cuts of beef with fat trimmed off. Low-sodium, lean deli meat. Dairy  Low-fat (1%) or fat-free (skim) milk. Fat-free, low-fat, or reduced-fat cheeses. Nonfat, low-sodium ricotta or cottage cheese. Low-fat or nonfat yogurt. Low-fat, low-sodium cheese. Fats and oils  Soft margarine without trans fats. Vegetable oil. Low-fat, reduced-fat, or light mayonnaise and salad dressings (reduced-sodium). Canola, safflower, olive, soybean, and sunflower oils. Avocado. Seasoning and other foods  Herbs. Spices. Seasoning mixes without salt. Unsalted popcorn and pretzels. Fat-free sweets. What foods are not recommended? The items listed may not be a complete list. Talk with your dietitian about what dietary choices are best for you. Grains  Baked goods made with fat, such as croissants, muffins, or some breads. Dry pasta or rice meal packs. Vegetables  Creamed or fried vegetables.  Vegetables in a cheese sauce. Regular canned vegetables (not low-sodium or reduced-sodium). Regular canned tomato sauce and paste (not low-sodium or reduced-sodium). Regular tomato and vegetable juice (not low-sodium or reduced-sodium). Pickles. Olives. Fruits  Canned fruit in a light or heavy syrup. Fried fruit. Fruit in cream or butter sauce. Meat and other protein foods  Fatty cuts of meat. Ribs. Fried meat. Bacon. Sausage. Bologna and other processed lunch meats. Salami. Fatback. Hotdogs. Bratwurst. Salted nuts and seeds. Canned beans with added salt. Canned or smoked fish. Whole eggs or egg yolks. Chicken or turkey with skin. Dairy  Whole or 2% milk, cream, and half-and-half. Whole or full-fat cream cheese. Whole-fat or sweetened yogurt. Full-fat cheese. Nondairy creamers. Whipped toppings. Processed cheese and cheese spreads. Fats and oils  Butter. Stick margarine. Lard. Shortening. Ghee. Bacon fat. Tropical oils, such as coconut, palm kernel, or palm oil. Seasoning and other foods  Salted popcorn and pretzels. Onion salt, garlic salt, seasoned salt, table salt, and sea salt. Worcestershire sauce. Tartar sauce. Barbecue sauce. Teriyaki sauce. Soy sauce, including reduced-sodium. Steak sauce. Canned and packaged gravies. Fish sauce. Oyster sauce. Cocktail sauce. Horseradish that you find on the shelf. Ketchup. Mustard. Meat flavorings   and tenderizers. Bouillon cubes. Hot sauce and Tabasco sauce. Premade or packaged marinades. Premade or packaged taco seasonings. Relishes. Regular salad dressings. Where to find more information:  National Heart, Lung, and Blood Institute: www.nhlbi.nih.gov  American Heart Association: www.heart.org Summary  The DASH eating plan is a healthy eating plan that has been shown to reduce high blood pressure (hypertension). It may also reduce your risk for type 2 diabetes, heart disease, and stroke.  With the DASH eating plan, you should limit salt (sodium) intake  to 2,300 mg a day. If you have hypertension, you may need to reduce your sodium intake to 1,500 mg a day.  When on the DASH eating plan, aim to eat more fresh fruits and vegetables, whole grains, lean proteins, low-fat dairy, and heart-healthy fats.  Work with your health care provider or diet and nutrition specialist (dietitian) to adjust your eating plan to your individual calorie needs. This information is not intended to replace advice given to you by your health care provider. Make sure you discuss any questions you have with your health care provider. Document Released: 12/17/2010 Document Revised: 12/22/2015 Document Reviewed: 12/22/2015 Elsevier Interactive Patient Education  2017 Elsevier Inc.  

## 2016-08-18 NOTE — Progress Notes (Signed)
Chief Complaint  Patient presents with  . Hypertension    new BP medications   . Follow-up    HPI Kaylee Lopez is a 51 yo female with a significant past medical history of hypertension who presents to the clinic today for a hypertension follow up.   Patient is checking her blood pressure at home and brought her log.Not all taken at the same time of the day: 4/22 111/72 4/23 124/78 4/26 128/90 5/2 114/66 5/6 120/80 5/7 128/88 5/14 119/80 6/4 135/88 6/5 122/81 6/13 117/72 6/14 125/83 6/16 116/72 6/25 110/67 July 112/124/63-82 August 113-121/78-80  Feeling good on the medication, she admits to adherance and has a routine takes it every morning.   Diet changes - breakfast having oatmeal. Cut out adding salt to her cooking, fast food for lunch, and processed meats.. Admits going out to eat is difficult, but tries to get smaller portions.  Review of Systems  Cardiovascular: Negative for chest pain and leg swelling.  Gastrointestinal: Negative for abdominal pain, constipation, diarrhea, nausea and vomiting.  Endocrine: Negative for polyuria.  Musculoskeletal: Negative for arthralgias.     There are no active problems to display for this patient.  Prior to Admission medications   Medication Sig Start Date End Date Taking? Authorizing Provider  fluticasone (FLONASE) 50 MCG/ACT nasal spray SHAKE LIQUID AND USE 2 SPRAYS IN EACH NOSTRIL AT BEDTIME 07/20/16  Yes Shawnee Knapp, MD  hydrochlorothiazide (HYDRODIURIL) 25 MG tablet TAKE 1 TABLET(25 MG) BY MOUTH DAILY 08/16/16  Yes Shawnee Knapp, MD  loratadine (CLARITIN) 10 MG tablet Take 10 mg by mouth daily.   Yes [provider]  olopatadine (PATANOL) 0.1 % ophthalmic solution  07/01/16  Yes [provider]  omeprazole (PRILOSEC) 40 MG capsule Take 1 capsule (40 mg total) by mouth daily. 30 minutes before a meal 04/29/16  Yes Shawnee Knapp, MD  polyethylene glycol powder (GLYCOLAX/MIRALAX) powder Take 17 g by mouth 2  (two) times daily as needed. 08/05/14  Yes McVeigh, Sherren Mocha, PA   No Known Allergies  Vital signs BP (!) 133/92   Pulse 93   Temp 98.1 F (36.7 C) (Oral)   Resp 18   Ht 5\' 4"  (1.626 m)   Wt 176 lb 3.2 oz (79.9 kg)   SpO2 96%   BMI 30.24 kg/m   Physical Exam  Constitutional: She appears well-developed and well-nourished.  Cardiovascular: Normal rate, regular rhythm and normal heart sounds.   Pulses:      Radial pulses are 2+ on the right side, and 2+ on the left side.  Pulmonary/Chest: Effort normal and breath sounds normal.     Assessment and Plan 1. Essential hypertension Continue to make healthy eating choices. Please come back in 3 months for a follow up visit. - Comprehensive metabolic panel  2. Transaminitis - Comprehensive metabolic panel  3. Medication monitoring encounter - Comprehensive metabolic panel  4. Menopausal state  Kaylee Lopez "Mia" Biomedical engineer, [facility] 08/18/16 2:32 PM

## 2016-08-19 LAB — COMPREHENSIVE METABOLIC PANEL
ALT: 75 IU/L — ABNORMAL HIGH (ref 0–32)
AST: 50 IU/L — AB (ref 0–40)
Albumin/Globulin Ratio: 1.9 (ref 1.2–2.2)
Albumin: 4.6 g/dL (ref 3.5–5.5)
Alkaline Phosphatase: 59 IU/L (ref 39–117)
BILIRUBIN TOTAL: 0.6 mg/dL (ref 0.0–1.2)
BUN/Creatinine Ratio: 16 (ref 9–23)
BUN: 12 mg/dL (ref 6–24)
CHLORIDE: 102 mmol/L (ref 96–106)
CO2: 24 mmol/L (ref 20–29)
Calcium: 9.4 mg/dL (ref 8.7–10.2)
Creatinine, Ser: 0.75 mg/dL (ref 0.57–1.00)
GFR, EST AFRICAN AMERICAN: 107 mL/min/{1.73_m2} (ref >59)
GFR, EST NON AFRICAN AMERICAN: 93 mL/min/{1.73_m2} (ref >59)
GLOBULIN, TOTAL: 2.4 g/dL (ref 1.5–4.5)
Glucose: 103 mg/dL — ABNORMAL HIGH (ref 65–99)
POTASSIUM: 3.6 mmol/L (ref 3.5–5.2)
SODIUM: 142 mmol/L (ref 134–144)
TOTAL PROTEIN: 7 g/dL (ref 6.0–8.5)

## 2017-01-14 LAB — HM DEXA SCAN: HM Dexa Scan: -0.8

## 2017-03-10 ENCOUNTER — Other Ambulatory Visit (HOSPITAL_COMMUNITY): Payer: Self-pay | Admitting: Otolaryngology

## 2017-03-10 DIAGNOSIS — H93A1 Pulsatile tinnitus, right ear: Secondary | ICD-10-CM

## 2017-03-17 ENCOUNTER — Ambulatory Visit (HOSPITAL_COMMUNITY)
Admission: RE | Admit: 2017-03-17 | Discharge: 2017-03-17 | Disposition: A | Discharge status: Home / Self Care | Payer: BLUE CROSS/BLUE SHIELD | Source: Ambulatory Visit | Attending: Otolaryngology | Admitting: Otolaryngology

## 2017-03-17 DIAGNOSIS — H93A1 Pulsatile tinnitus, right ear: Secondary | ICD-10-CM | POA: Diagnosis not present

## 2017-03-17 DIAGNOSIS — Z8249 Family history of ischemic heart disease and other diseases of the circulatory system: Secondary | ICD-10-CM | POA: Diagnosis not present

## 2017-03-17 LAB — POCT I-STAT CREATININE: Creatinine, Ser: 0.8 mg/dL (ref 0.44–1.00)

## 2017-03-17 MED ORDER — GADOBENATE DIMEGLUMINE 529 MG/ML IV SOLN
20.0000 mL | Freq: Once | INTRAVENOUS | Status: AC | PRN
  Administered 2017-03-17: 16 mL via INTRAVENOUS

## 2017-03-24 ENCOUNTER — Other Ambulatory Visit: Payer: Self-pay | Admitting: Family Medicine

## 2017-08-18 ENCOUNTER — Other Ambulatory Visit: Payer: Self-pay | Admitting: Obstetrics and Gynecology

## 2017-08-18 DIAGNOSIS — Z803 Family history of malignant neoplasm of breast: Secondary | ICD-10-CM

## 2017-08-25 ENCOUNTER — Other Ambulatory Visit: Payer: Self-pay | Admitting: Family Medicine

## 2017-08-30 ENCOUNTER — Ambulatory Visit
Admission: RE | Admit: 2017-08-30 | Discharge: 2017-08-30 | Disposition: A | Discharge status: Home / Self Care | Payer: BLUE CROSS/BLUE SHIELD | Source: Ambulatory Visit | Attending: Obstetrics and Gynecology | Admitting: Obstetrics and Gynecology

## 2017-08-30 DIAGNOSIS — Z803 Family history of malignant neoplasm of breast: Secondary | ICD-10-CM

## 2017-08-30 MED ORDER — GADOBENATE DIMEGLUMINE 529 MG/ML IV SOLN
14.0000 mL | Freq: Once | INTRAVENOUS | Status: AC | PRN
  Administered 2017-08-30: 14 mL via INTRAVENOUS

## 2017-09-07 ENCOUNTER — Ambulatory Visit (INDEPENDENT_AMBULATORY_CARE_PROVIDER_SITE_OTHER): Payer: BLUE CROSS/BLUE SHIELD | Admitting: Family Medicine

## 2017-09-07 ENCOUNTER — Other Ambulatory Visit: Payer: Self-pay

## 2017-09-07 ENCOUNTER — Encounter: Payer: Self-pay | Admitting: Family Medicine

## 2017-09-07 VITALS — BP 118/82 | HR 87 | Temp 98.0°F | Resp 16 | Ht 65.35 in | Wt 148.8 lb

## 2017-09-07 DIAGNOSIS — Z1389 Encounter for screening for other disorder: Secondary | ICD-10-CM | POA: Diagnosis not present

## 2017-09-07 DIAGNOSIS — Z0000 Encounter for general adult medical examination without abnormal findings: Secondary | ICD-10-CM

## 2017-09-07 DIAGNOSIS — Z136 Encounter for screening for cardiovascular disorders: Secondary | ICD-10-CM | POA: Diagnosis not present

## 2017-09-07 DIAGNOSIS — R740 Nonspecific elevation of levels of transaminase and lactic acid dehydrogenase [LDH]: Secondary | ICD-10-CM

## 2017-09-07 DIAGNOSIS — Z23 Encounter for immunization: Secondary | ICD-10-CM

## 2017-09-07 DIAGNOSIS — Z1383 Encounter for screening for respiratory disorder NEC: Secondary | ICD-10-CM

## 2017-09-07 DIAGNOSIS — N951 Menopausal and female climacteric states: Secondary | ICD-10-CM

## 2017-09-07 DIAGNOSIS — E559 Vitamin D deficiency, unspecified: Secondary | ICD-10-CM

## 2017-09-07 DIAGNOSIS — R7401 Elevation of levels of liver transaminase levels: Secondary | ICD-10-CM

## 2017-09-07 DIAGNOSIS — I10 Essential (primary) hypertension: Secondary | ICD-10-CM

## 2017-09-07 DIAGNOSIS — H93A1 Pulsatile tinnitus, right ear: Secondary | ICD-10-CM

## 2017-09-07 DIAGNOSIS — E785 Hyperlipidemia, unspecified: Secondary | ICD-10-CM

## 2017-09-07 MED ORDER — HYDROCHLOROTHIAZIDE 25 MG PO TABS: 25.0000 mg | ORAL_TABLET | Freq: Every day | ORAL | 3 refills | Status: DC

## 2017-09-07 MED ORDER — OLOPATADINE HCL 0.1 % OP SOLN: 1.0000 [drp] | Freq: Two times a day (BID) | OPHTHALMIC | 5 refills | Status: AC

## 2017-09-07 MED ORDER — CETIRIZINE HCL 10 MG PO TABS: 10.0000 mg | ORAL_TABLET | Freq: Every day | ORAL | 3 refills | Status: DC

## 2017-09-07 MED ORDER — FLUTICASONE PROPIONATE 50 MCG/ACT NA SUSP: NASAL | 11 refills | Status: DC

## 2017-09-07 NOTE — Patient Instructions (Addendum)
If you have lab work done today you will be contacted with your lab results within the next 2 weeks.  If you have not heard from [place] then please contact us. The fastest way to get your results is to register for My Chart.   IF you received an x-ray today, you will receive an invoice from [facility]. Please contact [facility] at [phone] with questions or concerns regarding your invoice.   IF you received labwork today, you will receive an invoice from [place]. Please contact [facility] at [phone] with questions or concerns regarding your invoice.   Our billing staff will not be able to assist you with questions regarding bills from these companies.  You will be contacted with the lab results as soon as they are available. The fastest way to get your results is to activate your My Chart account. Instructions are located on the last page of this paperwork. If you have not heard from [place] regarding the results in 2 weeks, please contact this office.     Fatty Liver Fatty liver, also called hepatic steatosis or steatohepatitis, is a condition in which too much fat has built up in your liver cells. The liver removes harmful substances from your bloodstream. It produces fluids your body needs. It also helps your body use and store energy from the food you eat. In many cases, fatty liver does not cause symptoms or problems. It is often diagnosed when tests are being done for other reasons. However, over time, fatty liver can cause inflammation that may lead to more serious liver problems, such as scarring of the liver (cirrhosis). What are the causes? Causes of fatty liver may include:  Drinking too much alcohol.  Poor nutrition.  Obesity.  Cushing syndrome.  Diabetes.  Hyperlipidemia.  Pregnancy.  Certain drugs.  Poisons.  Some viral infections.  What increases the risk? You may be more likely to develop fatty liver if you:  Abuse alcohol.  Are  pregnant.  Are overweight.  Have diabetes.  Have hepatitis.  Have a high triglyceride level.  What are the signs or symptoms? Fatty liver often does not cause any symptoms. In cases where symptoms develop, they can include:  Fatigue.  Weakness.  Weight loss.  Confusion.  Abdominal pain.  Yellowing of your skin and the white parts of your eyes (jaundice).  Nausea and vomiting.  How is this diagnosed? Fatty liver may be diagnosed by:  Physical exam and medical history.  Blood tests.  Imaging tests, such as an ultrasound, CT scan, or MRI.  Liver biopsy. A small sample of liver tissue is removed using a needle. The sample is then looked at under a microscope.  How is this treated? Fatty liver is often caused by other health conditions. Treatment for fatty liver may involve medicines and lifestyle changes to manage conditions such as:  Alcoholism.  High cholesterol.  Diabetes.  Being overweight or obese.  Follow these instructions at home:  Eat a healthy diet as directed by your health care provider.  Exercise regularly. This can help you lose weight and control your cholesterol and diabetes. Talk to your health care provider about an exercise plan and which activities are best for you.  Do not drink alcohol.  Take medicines only as directed by your health care provider. Contact a health care provider if: You have difficulty controlling your:  Blood sugar.  Cholesterol.  Alcohol consumption.  Get help right away if:  You have abdominal pain.  You have jaundice.  You have nausea and vomiting. This information is not intended to replace advice given to you by your health care provider. Make sure you discuss any questions you have with your health care provider. Document Released: 02/12/2005 Document Revised: 06/05/2015 Document Reviewed: 05/09/2013 Elsevier Interactive Patient Education  2018 [place] protect organs,  store calcium, and anchor muscles. Good health habits, such as eating nutritious foods and exercising regularly, are important for maintaining healthy bones. They can also help to prevent a condition that causes bones to lose density and become weak and brittle (osteoporosis). Why is bone mass important? Bone mass refers to the amount of bone tissue that you have. The higher your bone mass, the stronger your bones. An important step toward having healthy bones throughout life is to have strong and dense bones during childhood. A young adult who has a high bone mass is more likely to have a high bone mass later in life. Bone mass at its greatest it is called peak bone mass. A large decline in bone mass occurs in older adults. In women, it occurs about the time of menopause. During this time, it is important to practice good health habits, because if more bone is lost than what is replaced, the bones will become less healthy and more likely to break (fracture). If you find that you have a low bone mass, you may be able to prevent osteoporosis or further bone loss by changing your diet and lifestyle. How can I find out if my bone mass is low? Bone mass can be measured with an X-ray test that is called a bone mineral density (BMD) test. This test is recommended for all women who are age 29 or older. It may also be recommended for men who are age 38 or older, or for people who are more likely to develop osteoporosis due to:  Having bones that break easily.  Having a long-term disease that weakens bones, such as kidney disease or rheumatoid arthritis.  Having menopause earlier than normal.  Taking medicine that weakens bones, such as steroids, thyroid hormones, or hormone treatment for breast cancer or prostate cancer.  Smoking.  Drinking three or more alcoholic drinks each day.  What are the nutritional recommendations for healthy bones? To have healthy bones, you need to get enough of the right  minerals and vitamins. Most nutrition experts recommend getting these nutrients from the foods that you eat. Nutritional recommendations vary from person to person. Ask your health care provider what is healthy for you. Here are some general guidelines. Calcium Recommendations Calcium is the most important (essential) mineral for bone health. Most people can get enough calcium from their diet, but supplements may be recommended for people who are at risk for osteoporosis. Good sources of calcium include:  Dairy products, such as low-fat or nonfat milk, cheese, and yogurt.  Dark green leafy vegetables, such as bok choy and broccoli.  Calcium-fortified foods, such as orange juice, cereal, bread, soy beverages, and tofu products.  Nuts, such as almonds.  Follow these recommended amounts for daily calcium intake:  Children, age 31?3: 700 mg.  Children, age 40?8: 1,000 mg.  Children, age 34?13: 1,300 mg.  Teens, age 50?18: 1,300 mg.  Adults, age 83?50: 1,000 mg.  Adults, age 405?70: ? Men: 1,000 mg. ? Women: 1,200 mg.  Adults, age 60 or older: 1,200 mg.  Pregnant and breastfeeding females: ? Teens: 1,300 mg. ? Adults: 1,000 mg.  Vitamin  D Recommendations Vitamin D is the most essential vitamin for bone health. It helps the body to absorb calcium. Sunlight stimulates the skin to make vitamin D, so be sure to get enough sunlight. If you live in a cold climate or you do not get outside often, your health care provider may recommend that you take vitamin D supplements. Good sources of vitamin D in your diet include:  Egg yolks.  Saltwater fish.  Milk and cereal fortified with vitamin D.  Follow these recommended amounts for daily vitamin D intake:  Children and teens, age 49?18: 49 international units.  Adults, age 17 or younger: 400-800 international units.  Adults, age 4 or older: 800-1,000 international units.  Other Nutrients Other nutrients for bone health  include:  Phosphorus. This mineral is found in meat, poultry, dairy foods, nuts, and legumes. The recommended daily intake for adult men and adult women is 700 mg.  Magnesium. This mineral is found in seeds, nuts, dark green vegetables, and legumes. The recommended daily intake for adult men is 400?420 mg. For adult women, it is 310?320 mg.  Vitamin K. This vitamin is found in green leafy vegetables. The recommended daily intake is 120 mg for adult men and 90 mg for adult women.  What type of physical activity is best for building and maintaining healthy bones? Weight-bearing and strength-building activities are important for building and maintaining peak bone mass. Weight-bearing activities cause muscles and bones to work against gravity. Strength-building activities increases muscle strength that supports bones. Weight-bearing and muscle-building activities include:  Walking and hiking.  Jogging and running.  Dancing.  Gym exercises.  Lifting weights.  Tennis and racquetball.  Climbing stairs.  Aerobics.  Adults should get at least 30 minutes of moderate physical activity on most days. Children should get at least 60 minutes of moderate physical activity on most days. Ask your health care provide what type of exercise is best for you. Where can I find more information? For more information, check out the following websites:  [place]: YardHomes.se  [employer] of Health: http://www.niams.AnonymousEar.fr.asp  This information is not intended to replace advice given to you by your health care provider. Make sure you discuss any questions you have with your health care provider. Document Released: 03/20/2003 Document Revised: 07/18/2015 Document Reviewed: 01/02/2014 Elsevier Interactive Patient Education  2018 [place].  Calcium Intake Recommendations Calcium is a mineral that affects  many functions in the body, including:  Blood clotting.  Blood vessel function.  Nerve impulse conduction.  Hormone secretion.  Muscle contraction.  Bone and teeth functions.  Most of your body's calcium supply is stored in your bones and teeth. When your calcium stores are low, you may be at risk for low bone mass, bone loss, and bone fractures. Consuming enough calcium helps to grow healthy bones and teeth and to prevent breakdown over time. It is very important that you get enough calcium if you are:  A child undergoing rapid growth.  An adolescent girl.  A pre- or post-menopausal woman.  A woman whose menstrual cycle has stopped due to anorexia nervosa or regular intense exercise.  An individual with lactose intolerance or a milk allergy.  A vegetarian.  What is my plan? Try to consume the recommended amount of calcium daily based on your age. Depending on your overall health, your health care provider may recommend increased calcium intake.General daily calcium intake recommendations by age are:  Birth to 6 months: 200 mg.  Infants 7 to  12 months: 260 mg.  Children 1 to 3 years: 700 mg.  Children 4 to 8 years: 1,000 mg.  Children 9 to 13 years: 1,300 mg.  Teens 14 to 18 years: 1,300 mg.  Adults 19 to 50 years: 1,000 mg.  Adult women 51 to 70 years: 1,200 mg.  Adult men 51 to 70 years: 1,000 mg.  Adults 71 years and older: 1,200 mg.  Pregnant and breastfeeding teens: 1,300 mg.  Pregnant and breastfeeding adults: 1,000 mg.  What do I need to know about calcium intake?  In order for the body to absorb calcium, it needs vitamin D. You can get vitamin D through: ? Direct exposure of the skin to sunlight. ? Foods, such as egg yolks, liver, saltwater fish, and fortified milk. ? Supplements.  Consuming too much calcium may cause: ? Constipation. ? Decreased absorption of iron and zinc. ? Kidney stones.  Calcium supplements may interact with certain  medicines. Check with your health care provider before starting any calcium supplements.  Try to get most of your calcium from food. What foods can I eat? Grains  Fortified oatmeal. Fortified ready-to-eat cereals. Fortified frozen waffles. Vegetables Turnip greens. Broccoli. Fruits Fortified orange juice. Meats and Other Protein Sources Canned sardines with bones. Canned salmon with bones. Soy beans. Tofu. Baked beans. Almonds. [place] nuts. Sunflower seeds. Dairy Milk. Yogurt. Cheese. Cottage cheese. Beverages Fortified soy milk. Fortified rice milk. Sweets/Desserts Pudding. Ice Cream. Milkshakes. Blackstrap molasses. The items listed above may not be a complete list of recommended foods or beverages. Contact your dietitian for more options. What foods can affect my calcium intake? It may be more difficult for your body to use calcium or calcium may leave your body more quickly if you consume large amounts of:  Sodium.  Protein.  Caffeine.  Alcohol.  This information is not intended to replace advice given to you by your health care provider. Make sure you discuss any questions you have with your health care provider. Document Released: 08/12/2003 Document Revised: 07/18/2015 Document Reviewed: 06/05/2013 Elsevier Interactive Patient Education  2018 [employer].

## 2017-09-07 NOTE — Progress Notes (Signed)
Subjective:    Patient ID: Kaylee Lopez, female    DOB: December 29, 1965, 52 y.o.   MRN: [id number]  HPI Primary Preventative Screenings: Cervical Cancer: updated pap 12/22/16 at Dr. Radene Knee Family Planning: post-menopausal STI screening: no risk, neg HIV screen last yr Breast Cancer: breast MRI last wk and mammogram sched for 12/2017 so will every 6 mos alternating between the two. She had genetic testing which was largely negative except for potentially one unknown marker that we don't know much about.  Colorectal Cancer: 07/2016 - repeat in 5 yrs as 1 adenomatous polyp with Dr. Fuller Plan Tobacco use/EtOH/substances: Bone Density: copy of DEXA done 01/14/2017 brought from Dr. 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 Cut out carbs and when she does eat them earlier in the day BMI Readings from Last 3 Encounters:  09/07/17 24.49 kg/m  08/18/16 30.24 kg/m  07/21/16 30.38 kg/m   No results found for: HGBA1C OTC/Vit/Supp/Herbal:See med list - UTD Dentist/Optho: Sees both reg Immunizations:  Immunization History  Administered Date(s) Administered  . Influenza-Unspecified 11/12/2015  . Tdap 01/11/2010  . Zoster Recombinat (Shingrix) 11/19/2016  got second shingrix 3-39mos after first   Chronic Medical Conditions: HTN: Checked a few times in the past mo 110s-120/70s-low 80s. No med side effects with myalgias and urinary issues GERD: Had esophageal stretched a little during endoscopy and colonoscopy last yr and changed diet with no carbonation and taking garlic so off med. Allergies: Seeing ENT Dr. Gwynneth Munson for tinnitus - MRI nml and improves with staying on allergy med - using background white noise to negate so she can sleep.  Constipation: prn  miralax once a week or so - needs less if keeps well  hydated   Past Medical History:  Diagnosis Date  . Allergy   . Anxiety   . Bacterial meningitis   . GERD (gastroesophageal reflux disease)   . HTN (hypertension)    Past Surgical History:  Procedure Laterality Date  . NO PAST SURGERIES     Current Outpatient Medications on File Prior to Visit  Medication Sig Dispense Refill  . calcium-vitamin D 250-100 MG-UNIT tablet Take 1 tablet by mouth 2 (two) times daily.    . cetirizine (ZYRTEC) 10 MG tablet Take 1 tablet (10 mg total) by mouth daily. Office visit needed 30 tablet 0  . fluticasone (FLONASE) 50 MCG/ACT nasal spray SHAKE LIQUID AND USE 2 SPRAYS IN EACH NOSTRIL AT BEDTIME 16 g 5  . hydrochlorothiazide (HYDRODIURIL) 25 MG tablet TAKE 1 TABLET(25 MG) BY MOUTH DAILY 90 tablet 0  . loratadine (CLARITIN) 10 MG tablet Take 10 mg by mouth daily.    . Melatonin 3 MG CAPS Take by mouth.    Marland Kitchen olopatadine (PATANOL) 0.1 % ophthalmic solution   5  . polyethylene glycol powder (GLYCOLAX/MIRALAX) powder Take 17 g by mouth 2 (two) times daily as needed. 3350 g 1   Current Facility-Administered Medications on File Prior to Visit  Medication Dose Route Frequency Provider Last Rate Last Dose  . 0.9 %  sodium chloride infusion  500 mL Intravenous Continuous Ladene Artist, MD       No Known Allergies Family History  Problem Relation Age of Onset  . Heart disease Mother   . Hypertension Mother   . Hyperlipidemia Father   . Prostate cancer Father   .  Colon polyps Father        Pro b n peptide 281  . Breast cancer Maternal Grandmother   . Heart disease Maternal Grandfather   . Hypertension Maternal Grandfather   . Kidney disease Maternal Grandfather   . Stroke Paternal Grandmother   . Breast cancer Paternal Aunt   . Breast cancer Paternal Aunt   . Kidney disease Maternal Aunt   . Diabetes Maternal Aunt    Social History   Socioeconomic History  . Marital status: Married    Spouse name: Not on file  . Number of children: 2  .  Years of education: Not on file  . Highest education level: Not on file  Occupational History  . Occupation: physical therapist  Social Needs  . Financial resource strain: Not on file  . Food insecurity:    Worry: Not on file    Inability: Not on file  . Transportation needs:    Medical: Not on file    Non-medical: Not on file  Tobacco Use  . Smoking status: Never Smoker  . Smokeless tobacco: Never Used  Substance and Sexual Activity  . Alcohol use: Yes    Alcohol/week: 0.0 standard drinks    Comment: 2-3 per day  . Drug use: No  . Sexual activity: Not on file  Lifestyle  . Physical activity:    Days per week: Not on file    Minutes per session: Not on file  . Stress: Not on file  Relationships  . Social connections:    Talks on phone: Not on file    Gets together: Not on file    Attends religious service: Not on file    Active member of club or organization: Not on file    Attends meetings of clubs or organizations: Not on file    Relationship status: Not on file  Other Topics Concern  . Not on file  Social History Narrative  . Not on file   Depression screen [facility] 2/9 09/07/2017 09/07/2017 08/18/2016 04/29/2016 04/02/2016  Decreased Interest 0 0 0 0 0  Down, Depressed, Hopeless 0 0 0 0 0  PHQ - 2 Score 0 0 0 0 0      Review of Systems  HENT: Positive for tinnitus.   All other systems reviewed and are negative. Form not given    Objective:   Physical Exam  Constitutional: She is oriented to person, place, and time. She appears well-developed and well-nourished. No distress.  HENT:  Head: Normocephalic and atraumatic.  Right Ear: Tympanic membrane, external ear and ear canal normal.  Left Ear: Tympanic membrane, external ear and ear canal normal.  Nose: Nose normal. No mucosal edema or rhinorrhea.  Mouth/Throat: Uvula is midline, oropharynx is clear and moist and mucous membranes are normal. No posterior oropharyngeal erythema.  Eyes: Pupils are equal, round, and  reactive to light. Conjunctivae and EOM are normal. Right eye exhibits no discharge. Left eye exhibits no discharge. No scleral icterus.  Neck: Normal range of motion. Neck supple. No thyromegaly present.  Cardiovascular: Normal rate, regular rhythm, normal heart sounds and intact distal pulses.  Pulmonary/Chest: Effort normal and breath sounds normal. No respiratory distress.  Abdominal: Soft. Bowel sounds are normal. There is no tenderness.  Musculoskeletal: She exhibits no edema.  Lymphadenopathy:    She has no cervical adenopathy.  Neurological: She is alert and oriented to person, place, and time. She has normal reflexes.  Skin: Skin is warm and dry. She is not diaphoretic.  No erythema.  Psychiatric: She has a normal mood and affect. Her behavior is normal.      BP 118/82   Pulse 87   Temp 98 F (36.7 C) (Oral)   Resp 16   Ht 5' 5.35" (1.66 m)   Wt 148 lb 12.8 oz (67.5 kg)   SpO2 98%   BMI 24.49 kg/m   Assessment & Plan:   1. Need for prophylactic vaccination and inoculation against influenza   2. Annual physical exam   3. Screening for cardiovascular, respiratory, and genitourinary diseases   4. Essential hypertension   5. Transaminitis   6. Menopausal state   7. Pulsatile tinnitus of right ear - saw ENT  8. Vitamin D deficiency - on citracal max plus in addition to vit D3 supp 2500 qd  9. Hyperlipidemia LDL goal <130   Cont same meds - unchanged If liver not improved - will check liver US  Orders Placed This Encounter  Procedures  . HM DEXA SCAN    This external order was created through the Results Console.  . Flu Vaccine QUAD 36+ mos IM  . Lipid panel    Order Specific Question:   Has the patient fasted?    Answer:   Yes  . Comprehensive metabolic panel    Order Specific Question:   Has the patient fasted?    Answer:   Yes  . CBC  . VITAMIN D 25 Hydroxy (Vit-D Deficiency, Fractures)    Meds ordered this encounter  Medications  . cetirizine (ZYRTEC) 10  MG tablet    Sig: Take 1 tablet (10 mg total) by mouth daily.    Dispense:  90 tablet    Refill:  3  . fluticasone (FLONASE) 50 MCG/ACT nasal spray    Sig: SHAKE LIQUID AND USE 2 SPRAYS IN EACH NOSTRIL AT BEDTIME    Dispense:  16 g    Refill:  11  . hydrochlorothiazide (HYDRODIURIL) 25 MG tablet    Sig: Take 1 tablet (25 mg total) by mouth daily.    Dispense:  90 tablet    Refill:  3  . olopatadine (PATANOL) 0.1 % ophthalmic solution    Sig: Place 1 drop into both eyes 2 (two) times daily.    Dispense:  5 mL    Refill:  5     Delman Cheadle, M.D.  Primary Care at [facility] [address] [place], [place] [address] [phone] phone [phone] fax  09/07/17 8:58 AM

## 2017-09-08 LAB — CBC
HEMATOCRIT: 41.7 % (ref 34.0–46.6)
Hemoglobin: 14.4 g/dL (ref 11.1–15.9)
MCH: 32.1 pg (ref 26.6–33.0)
MCHC: 34.5 g/dL (ref 31.5–35.7)
MCV: 93 fL (ref 79–97)
PLATELETS: 257 10*3/uL (ref 150–450)
RBC: 4.49 x10E6/uL (ref 3.77–5.28)
RDW: 13.1 % (ref 12.3–15.4)
WBC: 5.9 10*3/uL (ref 3.4–10.8)

## 2017-09-08 LAB — COMPREHENSIVE METABOLIC PANEL
ALK PHOS: 53 IU/L (ref 39–117)
ALT: 41 IU/L — ABNORMAL HIGH (ref 0–32)
AST: 32 IU/L (ref 0–40)
Albumin/Globulin Ratio: 1.9 (ref 1.2–2.2)
Albumin: 4.5 g/dL (ref 3.5–5.5)
BUN / CREAT RATIO: 18 (ref 9–23)
BUN: 12 mg/dL (ref 6–24)
Bilirubin Total: 1.1 mg/dL (ref 0.0–1.2)
CO2: 28 mmol/L (ref 20–29)
CREATININE: 0.68 mg/dL (ref 0.57–1.00)
Calcium: 9.7 mg/dL (ref 8.7–10.2)
Chloride: 99 mmol/L (ref 96–106)
GFR, EST AFRICAN AMERICAN: 117 mL/min/{1.73_m2} (ref >59)
GFR, EST NON AFRICAN AMERICAN: 102 mL/min/{1.73_m2} (ref >59)
GLOBULIN, TOTAL: 2.4 g/dL (ref 1.5–4.5)
Glucose: 94 mg/dL (ref 65–99)
Potassium: 4.9 mmol/L (ref 3.5–5.2)
SODIUM: 142 mmol/L (ref 134–144)
TOTAL PROTEIN: 6.9 g/dL (ref 6.0–8.5)

## 2017-09-08 LAB — VITAMIN D 25 HYDROXY (VIT D DEFICIENCY, FRACTURES): Vit D, 25-Hydroxy: 63.6 ng/mL (ref 30.0–100.0)

## 2017-09-08 LAB — LIPID PANEL
CHOLESTEROL TOTAL: 186 mg/dL (ref 100–199)
Chol/HDL Ratio: 2.7 ratio (ref 0.0–4.4)
HDL: 68 mg/dL (ref >39)
LDL Calculated: 104 mg/dL — ABNORMAL HIGH (ref 0–99)
Triglycerides: 69 mg/dL (ref 0–149)
VLDL CHOLESTEROL CAL: 14 mg/dL (ref 5–40)

## 2017-11-23 ENCOUNTER — Other Ambulatory Visit: Payer: Self-pay | Admitting: Family Medicine

## 2018-01-17 DIAGNOSIS — Z1231 Encounter for screening mammogram for malignant neoplasm of breast: Secondary | ICD-10-CM | POA: Diagnosis not present

## 2018-01-17 DIAGNOSIS — Z6827 Body mass index (BMI) 27.0-27.9, adult: Secondary | ICD-10-CM | POA: Diagnosis not present

## 2018-01-17 DIAGNOSIS — Z01419 Encounter for gynecological examination (general) (routine) without abnormal findings: Secondary | ICD-10-CM | POA: Diagnosis not present

## 2018-01-17 LAB — HM MAMMOGRAPHY

## 2018-08-04 IMAGING — MR MR HEAD WO/W CM
10 of 12 series · 28 of 48 positions shown · IV contrast (multihance)
Comparison: None.

CLINICAL DATA: Right pulsatile tinnitus.  Chronic otitis media.

EXAM:
MRI HEAD WITHOUT AND WITH CONTRAST
TECHNIQUE: Multiplanar, multiecho pulse sequences of the brain and surrounding
structures were obtained without and with intravenous contrast.
CONTRAST:  16mL MULTIHANCE GADOBENATE DIMEGLUMINE 529 MG/ML IV SOLN

[Series 3: T1 · sagittal · 5.0mm · 0.47mm/px · 1 of 24 slices shown (1 of 3)]
[im 1/24]
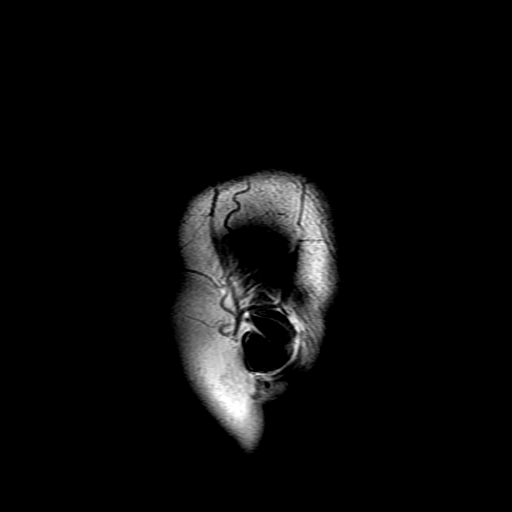

[Series 4: DWI · axial · 3.0mm · 1.09mm/px · z∈[-77,+62]mm · 8 of 100 slices shown (1 of 2)]
[im 1/100]
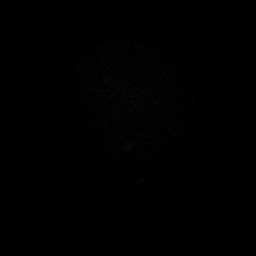
[im 12/100]
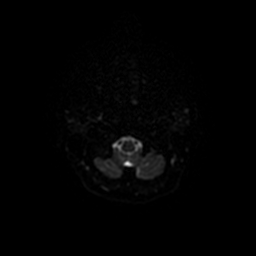
[im 34/100]
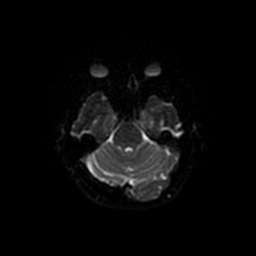
[im 45/100]
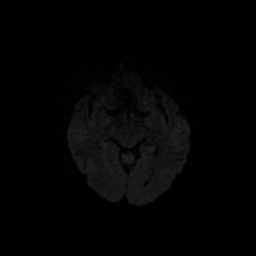
[im 56/100]
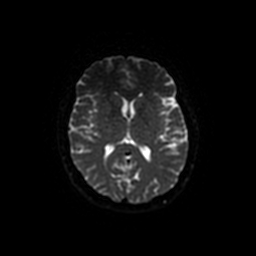
[im 67/100]
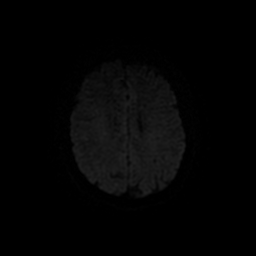
[im 89/100]
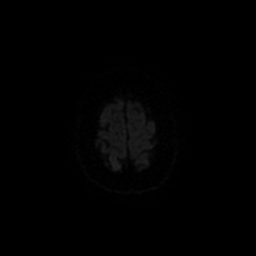
[im 100/100]
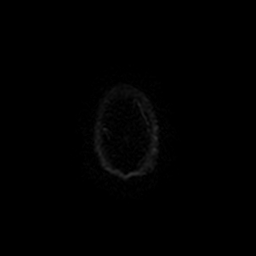

[Series 5: T2 · axial · 5.0mm · 0.43mm/px · z∈[-85,+62]mm · 2 of 24 slices shown]
[im 1/24]
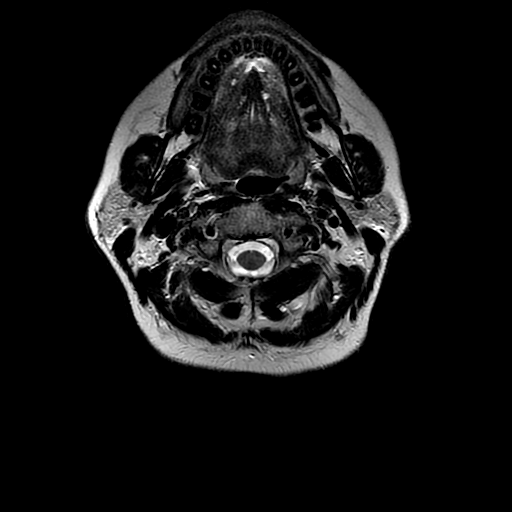
[im 24/24]
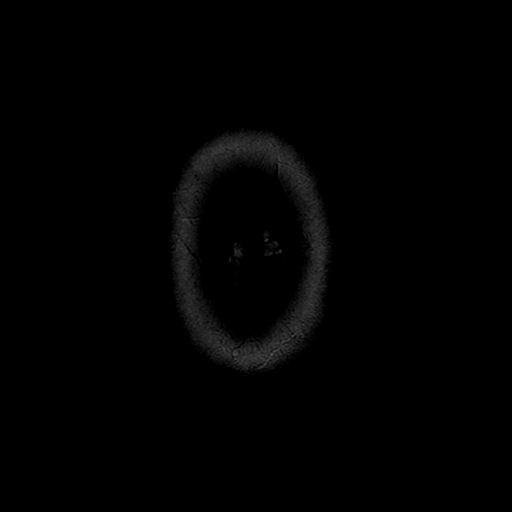

[Series 6: FLAIR · axial · 3.0mm · 0.43mm/px · z∈[-73,+68]mm · 3 of 26 slices shown]
[im 1/26]
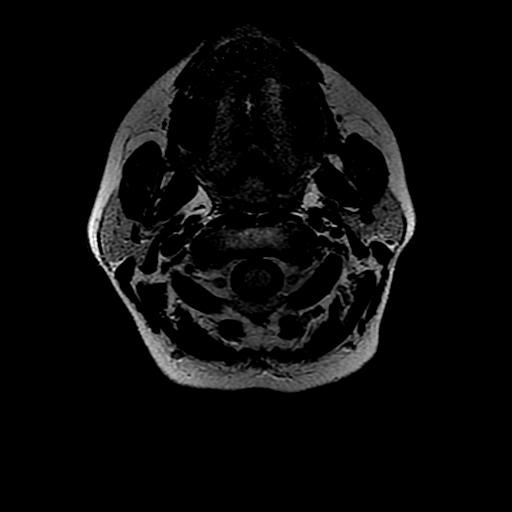
[im 13/26]
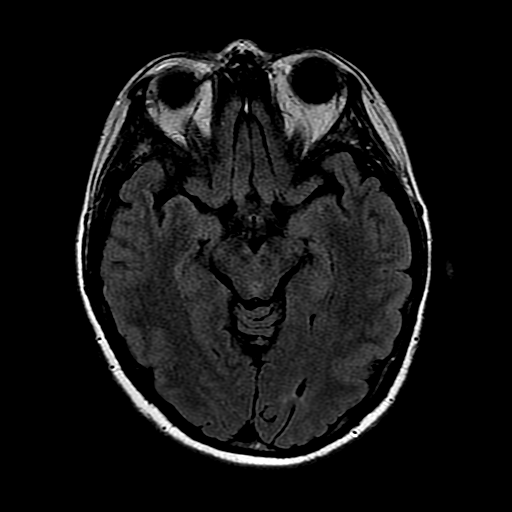
[im 26/26]
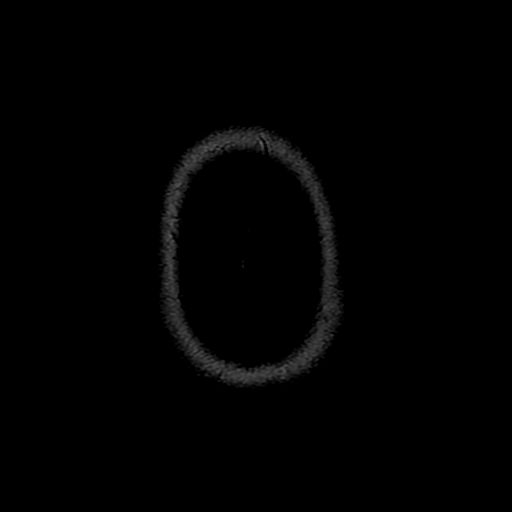

[Series 7: bSSFP · axial · 1.2mm · 0.35mm/px · 1 of 104 slices shown]
[im 1/104]
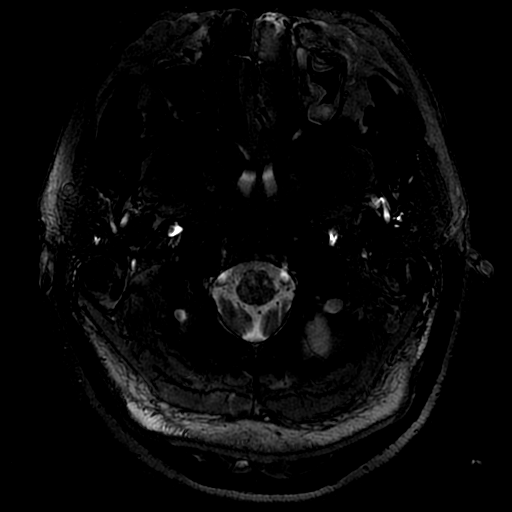

[Series 8: T1 · axial · 3.0mm · 0.35mm/px · z∈[-52,-3]mm · 2 of 18 slices shown (2 of 3)]
[im 1/18]
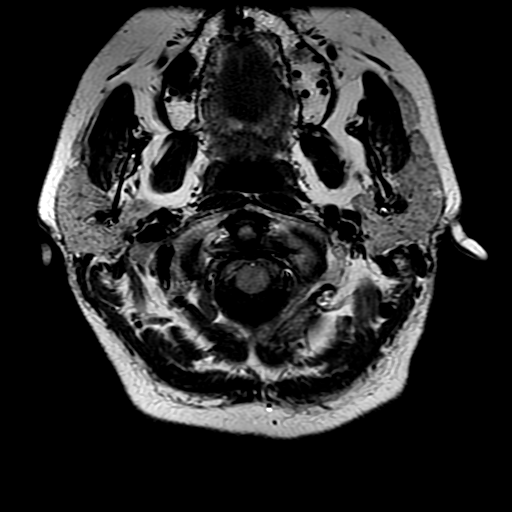
[im 18/18]
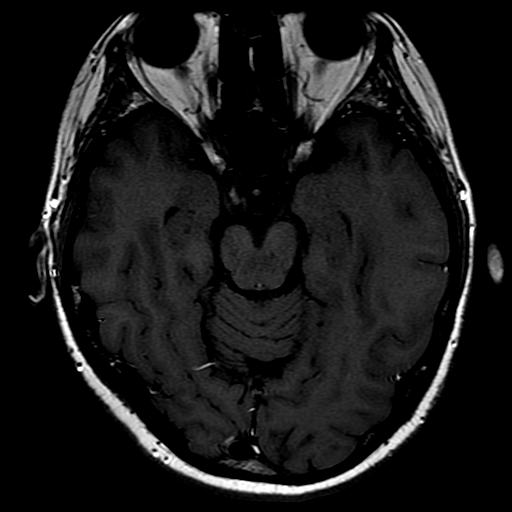

[Series 9: T1 · coronal · 3.0mm · 0.35mm/px · 2 of 18 slices shown (3 of 3)]
[im 1/18]
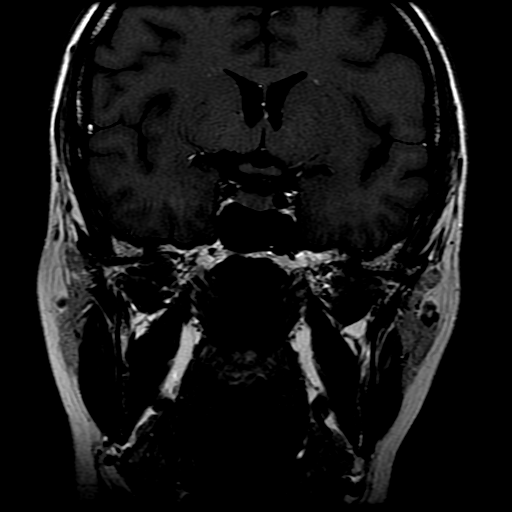
[im 18/18]
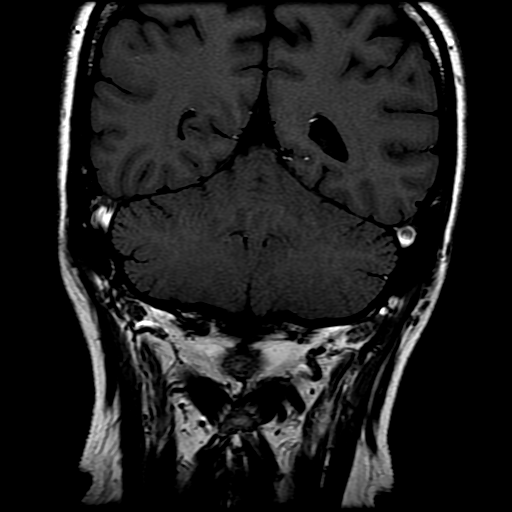

[Series 11: T1 post-contrast · axial · 3.0mm · 0.35mm/px · z∈[-52,-3]mm · 2 of 18 slices shown (1 of 2)]
[im 1/18]
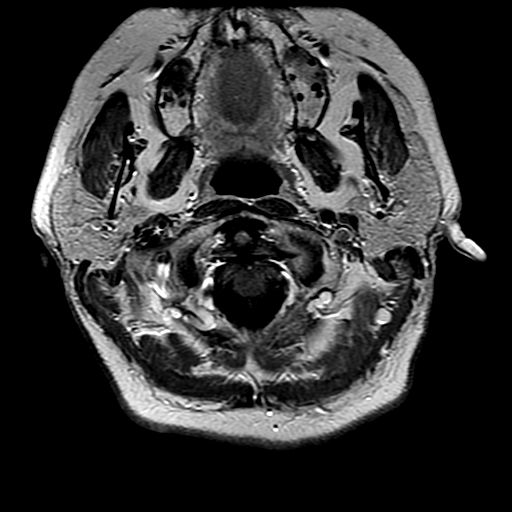
[im 18/18]
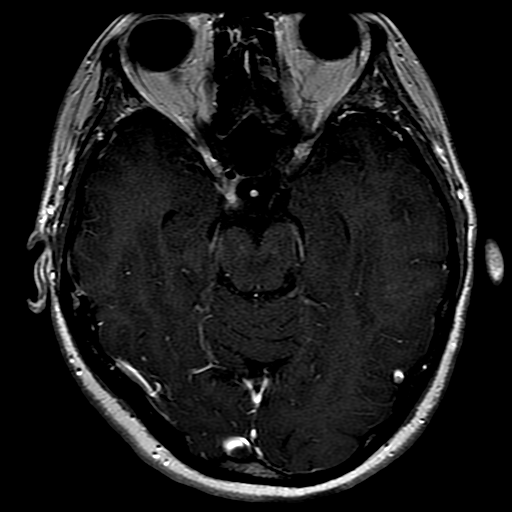

[Series 12: T1 post-contrast · coronal · 3.0mm · 0.35mm/px · 2 of 18 slices shown (2 of 2)]
[im 1/18]
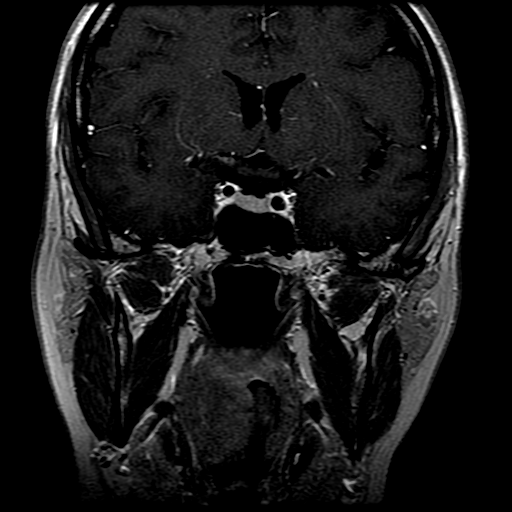
[im 18/18]
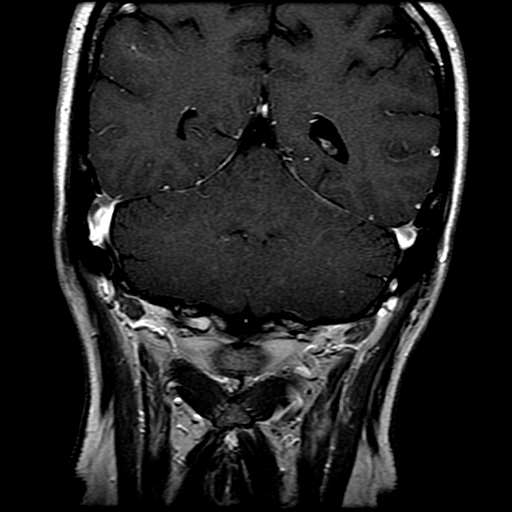

[Series 400: DWI · axial · 3.0mm · 1.09mm/px · z∈[-77,+62]mm · 5 of 50 slices shown (2 of 2)]
[im 1/50]
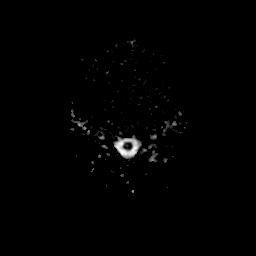
[im 13/50]
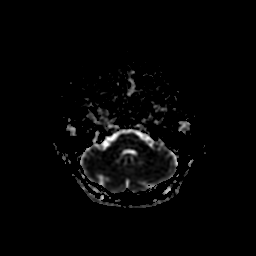
[im 25/50]
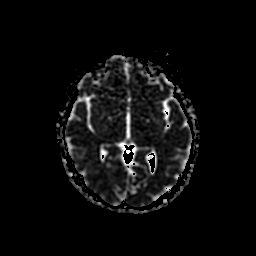
[im 37/50]
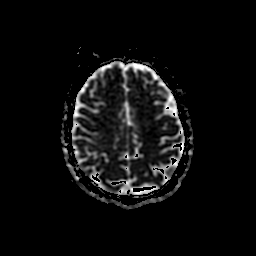
[im 50/50]
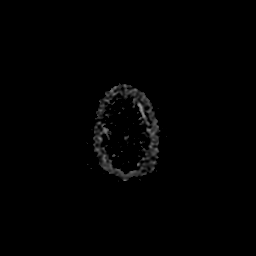

[28 of 48 positions shown; findings below may reference images not displayed]

FINDINGS: Brain: There is no evidence of acute infarct, intracranial
hemorrhage, mass, midline shift, or extra-axial fluid collection.
The ventricles and sulci are normal. No significant cerebral white
matter disease is seen. No abnormal enhancement is identified.

Dedicated imaging through the internal auditory canals demonstrates
a normal course of cranial nerves VII and VIII without evidence of
mass or abnormal enhancement. Inner ear structures demonstrate
normal signal bilaterally. No mass is seen within the
cerebellopontine angles.

Vascular: Major intracranial vascular flow voids are preserved.
Major dural venous sinuses enhance normally. Mildly dominant right
transverse and sigmoid sinuses.

Skull and upper cervical spine: Unremarkable bone marrow signal.

Sinuses/Orbits: Unremarkable orbits. Minimal left ethmoid and
maxillary sinus mucosal thickening. Clear mastoid air cells.

Other: None.
IMPRESSION: Unremarkable appearance of the brain and internal auditory canals.

## 2018-08-30 ENCOUNTER — Telehealth: Payer: Self-pay | Admitting: Family Medicine

## 2018-08-30 NOTE — Telephone Encounter (Signed)
Pt requesting bp med refill. Pt has toc with Nolon Rod in October

## 2018-08-31 ENCOUNTER — Other Ambulatory Visit: Payer: Self-pay

## 2018-08-31 MED ORDER — HYDROCHLOROTHIAZIDE 25 MG PO TABS: 25.0000 mg | ORAL_TABLET | Freq: Every day | ORAL | 0 refills | Status: DC

## 2018-11-01 ENCOUNTER — Other Ambulatory Visit: Payer: Self-pay

## 2018-11-01 ENCOUNTER — Ambulatory Visit (INDEPENDENT_AMBULATORY_CARE_PROVIDER_SITE_OTHER): Payer: 59 | Admitting: Family Medicine

## 2018-11-01 ENCOUNTER — Encounter: Payer: Self-pay | Admitting: Family Medicine

## 2018-11-01 VITALS — BP 130/79 | HR 82 | Temp 97.9°F | Ht 63.35 in | Wt 176.8 lb

## 2018-11-01 DIAGNOSIS — Z0000 Encounter for general adult medical examination without abnormal findings: Secondary | ICD-10-CM

## 2018-11-01 DIAGNOSIS — Z760 Encounter for issue of repeat prescription: Secondary | ICD-10-CM

## 2018-11-01 DIAGNOSIS — I10 Essential (primary) hypertension: Secondary | ICD-10-CM

## 2018-11-01 MED ORDER — HYDROCHLOROTHIAZIDE 25 MG PO TABS: 25.0000 mg | ORAL_TABLET | Freq: Every day | ORAL | 3 refills | Status: DC

## 2018-11-01 MED ORDER — CETIRIZINE HCL 10 MG PO TABS: 10.0000 mg | ORAL_TABLET | Freq: Every day | ORAL | 3 refills | Status: AC

## 2018-11-01 MED ORDER — FLUTICASONE PROPIONATE 50 MCG/ACT NA SUSP: NASAL | 11 refills | Status: AC

## 2018-11-01 NOTE — Progress Notes (Signed)
Chief Complaint  Patient presents with  . Annual Exam    Subjective:  Kaylee Lopez is a 53 y.o. female here for a health maintenance visit.  Patient is established  pt  There are no active problems to display for this patient.  Hypertension: Patient here for follow-up of elevated blood pressure. She is exercising and is adherent to low salt diet.  Blood pressure is well controlled at home. Cardiac symptoms none. Patient denies chest pain, dyspnea, exertional chest pressure/discomfort, fatigue, lower extremity edema, orthopnea and palpitations.  Cardiovascular risk factors: hypertension and obesity (BMI >= 30 kg/m2). Use of agents associated with hypertension: none. History of target organ damage: none.   Past Medical History:  Diagnosis Date  . Allergy   . Anxiety   . Bacterial meningitis   . GERD (gastroesophageal reflux disease)   . HTN (hypertension)     Past Surgical History:  Procedure Laterality Date  . NO PAST SURGERIES       Outpatient Medications Prior to Visit  Medication Sig Dispense Refill  . Calcium Citrate-Vitamin D (CITRACAL MAXIMUM PO) Take by mouth.    Marland Kitchen olopatadine (PATANOL) 0.1 % ophthalmic solution Place 1 drop into both eyes 2 (two) times daily. 5 mL 5  . polyethylene glycol powder (GLYCOLAX/MIRALAX) powder Take 17 g by mouth 2 (two) times daily as needed. 3350 g 1  . cetirizine (ZYRTEC) 10 MG tablet Take 1 tablet (10 mg total) by mouth daily. 90 tablet 3  . fluticasone (FLONASE) 50 MCG/ACT nasal spray SHAKE LIQUID AND USE 2 SPRAYS IN EACH NOSTRIL AT BEDTIME 16 g 11  . hydrochlorothiazide (HYDRODIURIL) 25 MG tablet Take 1 tablet (25 mg total) by mouth daily. 90 tablet 0  . calcium-vitamin D 250-100 MG-UNIT tablet Take 1 tablet by mouth 2 (two) times daily.    . Garlic 10 MG CAPS Take by mouth.    . hydrochlorothiazide (HYDRODIURIL) 25 MG tablet TAKE 1 TABLET(25 MG) BY MOUTH DAILY 90 tablet 2  . Melatonin 3 MG CAPS Take by mouth.    . Omega-3 Fatty  Acids (FISH OIL) 1000 MG CAPS Take by mouth.    . venlafaxine XR (EFFEXOR-XR) 37.5 MG 24 hr capsule TK 1 C PO QD UTD     No facility-administered medications prior to visit.     No Known Allergies   Family History  Problem Relation Age of Onset  . Heart disease Mother   . Hypertension Mother   . Hyperlipidemia Father   . Prostate cancer Father   . Colon polyps Father        Pro b n peptide 281  . Breast cancer Maternal Grandmother   . Heart disease Maternal Grandfather   . Hypertension Maternal Grandfather   . Kidney disease Maternal Grandfather   . Stroke Paternal Grandmother   . Breast cancer Paternal Aunt   . Breast cancer Paternal Aunt   . Kidney disease Maternal Aunt   . Diabetes Maternal Aunt      Health Habits: Dental Exam: up to date Eye Exam: up to date Exercise: 7 times/week on average Current exercise activities: walking Diet: low sodium diet  Social History   Socioeconomic History  . Marital status: Married    Spouse name: Not on file  . Number of children: 2  . Years of education: Not on file  . Highest education level: Not on file  Occupational History  . Occupation: physical therapist  Social Needs  . Financial resource strain: Not on file  .  Food insecurity    Worry: Not on file    Inability: Not on file  . Transportation needs    Medical: Not on file    Non-medical: Not on file  Tobacco Use  . Smoking status: Never Smoker  . Smokeless tobacco: Never Used  Substance and Sexual Activity  . Alcohol use: Yes    Alcohol/week: 0.0 standard drinks    Comment: 2-3 per day  . Drug use: No  . Sexual activity: Not on file  Lifestyle  . Physical activity    Days per week: Not on file    Minutes per session: Not on file  . Stress: Not on file  Relationships  . Social Herbalist on phone: Not on file    Gets together: Not on file    Attends religious service: Not on file    Active member of club or organization: Not on file     Attends meetings of clubs or organizations: Not on file    Relationship status: Not on file  . Intimate partner violence    Fear of current or ex partner: Not on file    Emotionally abused: Not on file    Physically abused: Not on file    Forced sexual activity: Not on file  Other Topics Concern  . Not on file  Social History Narrative  . Not on file   Social History   Substance and Sexual Activity  Alcohol Use Yes  . Alcohol/week: 0.0 standard drinks   Comment: 2-3 per day   Social History   Tobacco Use  Smoking Status Never Smoker  Smokeless Tobacco Never Used   Social History   Substance and Sexual Activity  Drug Use No    GYN: Sexual Health Menstrual status: regular menses LMP: No LMP recorded. (Menstrual status: Perimenopausal).  Sees Dr. Marigene Ehlers Last pap smear: see HM section History of abnormal pap smears:  Sexually active: ** with ** partner Current contraception:   Health Maintenance: See under health Maintenance activity for review of completion dates as well. Immunization History  Administered Date(s) Administered  . Influenza,inj,Quad PF,6+ Mos 09/07/2017, 10/23/2018  . Influenza-Unspecified 11/12/2015  . Tdap 01/11/2010  . Zoster Recombinat (Shingrix) 11/19/2016, 03/11/2017      Depression Screen-PHQ2/9 Depression screen [facility] 2/9 11/01/2018 09/07/2017 09/07/2017 08/18/2016 04/29/2016  Decreased Interest 0 0 0 0 0  Down, Depressed, Hopeless 0 0 0 0 0  PHQ - 2 Score 0 0 0 0 0       Depression Severity and Treatment Recommendations:  0-4= None  5-9= Mild / Treatment: Support, educate to call if worse; return in one month  10-14= Moderate / Treatment: Support, watchful waiting; Antidepressant or Psycotherapy  15-19= Moderately severe / Treatment: Antidepressant OR Psychotherapy  >= 20 = Major depression, severe / Antidepressant AND Psychotherapy    Review of Systems   ROS  See HPI for ROS as well.  Review of Systems  Constitutional:  Negative for activity change, appetite change, chills and fever.  HENT: Negative for congestion, nosebleeds, trouble swallowing and voice change.   Respiratory: Negative for cough, shortness of breath and wheezing.   Gastrointestinal: Negative for diarrhea, nausea and vomiting.  Genitourinary: Negative for difficulty urinating, dysuria, flank pain and hematuria.  Musculoskeletal: Negative for back pain, joint swelling and neck pain.  Neurological: Negative for dizziness, speech difficulty, light-headedness and numbness.  See HPI. All other review of systems negative.    Objective:   Vitals:   11/01/18  0812  BP: 130/79  Pulse: 82  Temp: 97.9 F (36.6 C)  SpO2: 97%  Weight: 176 lb 12.8 oz (80.2 kg)  Height: 5' 3.35" (1.609 m)    Body mass index is 30.97 kg/m.  Physical Exam  BP 130/79   Pulse 82   Temp 97.9 F (36.6 C)   Ht 5' 3.35" (1.609 m)   Wt 176 lb 12.8 oz (80.2 kg)   SpO2 97%   BMI 30.97 kg/m   General Appearance:    Alert, cooperative, no distress, appears stated age  Head:    Normocephalic, without obvious abnormality, atraumatic  Eyes:    PERRL, conjunctiva/corneas clear, EOM's intact, fundi    benign, both eyes  Ears:    Normal TM's and external ear canals, both ears  Nose:   Nares normal, septum midline, mucosa normal, no drainage    or sinus tenderness  Throat:   Lips, mucosa, and tongue normal; teeth and gums normal  Neck:   Supple, symmetrical, trachea midline, no adenopathy;    thyroid:  no enlargement/tenderness/nodules; no carotid   bruit or JVD  Back:     Symmetric, no curvature, ROM normal, no CVA tenderness  Lungs:     Clear to auscultation bilaterally, respirations unlabored  Chest Wall:    No tenderness or deformity   Heart:    Regular rate and rhythm, S1 and S2 normal, no murmur, rub   or gallop  Breast Exam:    No tenderness, masses, or nipple abnormality  Abdomen:     Soft, non-tender, bowel sounds active all four quadrants,    no  masses, no organomegaly  Genitalia:  Pap up to date, deferred pelvic exam. Sees Gynecology     Extremities:   Extremities normal, atraumatic, no cyanosis or edema  Pulses:   2+ and symmetric all extremities  Skin:   Skin color, texture, turgor normal, no rashes or lesions  Lymph nodes:   Cervical, supraclavicular, and axillary nodes normal  Neurologic:   CNII-XII intact, normal strength, sensation and reflexes    throughout      Assessment/Plan:   Patient was seen for a health maintenance exam.  Counseled the patient on health maintenance issues. Reviewed her health mainteance schedule and ordered appropriate tests (see orders.) Counseled on regular exercise and weight management. Recommend regular eye exams and dental cleaning.   The following issues were addressed today for health maintenance:   [place] was seen today for annual exam.  Diagnoses and all orders for this visit:  Encounter for health maintenance examination in adult- Women's Health Maintenance Plan Advised monthly breast exam and annual mammogram Advised dental exam every six months Discussed stress management Discussed pap smear screening guidelines   -     Lipid panel -     CMP14+EGFR  Essential hypertension- Patient's blood pressure is at goal of 139/89 or less. Condition is stable. Continue current medications and treatment plan. I recommend that you exercise for 30-45 minutes 5 days a week. I also recommend a balanced diet with fruits and vegetables every day, lean meats, and little fried foods. The DASH diet (you can find this online) is a good example of this.  -     Lipid panel -     CMP14+EGFR  Encounter for medication refill -     cetirizine (ZYRTEC) 10 MG tablet; Take 1 tablet (10 mg total) by mouth daily. -     fluticasone (FLONASE) 50 MCG/ACT nasal spray; SHAKE LIQUID AND USE 2  SPRAYS IN EACH NOSTRIL AT BEDTIME -     hydrochlorothiazide (HYDRODIURIL) 25 MG tablet; Take 1 tablet (25 mg total)  by mouth daily.    No follow-ups on file.    Body mass index is 30.97 kg/m.:  Discussed the patient's BMI with patient. The BMI body mass index is 30.97 kg/m.     No future appointments.  Patient Instructions       If you have lab work done today you will be contacted with your lab results within the next 2 weeks.  If you have not heard from [place] then please contact us. The fastest way to get your results is to register for My Chart.   IF you received an x-ray today, you will receive an invoice from [facility]. Please contact [facility] at [phone] with questions or concerns regarding your invoice.   IF you received labwork today, you will receive an invoice from [place]. Please contact [facility] at [phone] with questions or concerns regarding your invoice.   Our billing staff will not be able to assist you with questions regarding bills from these companies.  You will be contacted with the lab results as soon as they are available. The fastest way to get your results is to activate your My Chart account. Instructions are located on the last page of this paperwork. If you have not heard from [place] regarding the results in 2 weeks, please contact this office.     Health Maintenance for Postmenopausal Women Menopause is a normal process in which your ability to get pregnant comes to an end. This process happens slowly over many months or years, usually between the ages of 27 and 80. Menopause is complete when you have missed your menstrual periods for 12 months. It is important to talk with your health care provider about some of the most common conditions that affect women after menopause (postmenopausal women). These include heart disease, cancer, and bone loss (osteoporosis). Adopting a healthy lifestyle and getting preventive care can help to promote your health and wellness. The actions you take can also lower your chances of developing some of these  common conditions. What should I know about menopause? During menopause, you may get a number of symptoms, such as:  Hot flashes. These can be moderate or severe.  Night sweats.  Decrease in sex drive.  Mood swings.  Headaches.  Tiredness.  Irritability.  Memory problems.  Insomnia. Choosing to treat or not to treat these symptoms is a decision that you make with your health care provider. Do I need hormone replacement therapy?  Hormone replacement therapy is effective in treating symptoms that are caused by menopause, such as hot flashes and night sweats.  Hormone replacement carries certain risks, especially as you become older. If you are thinking about using estrogen or estrogen with progestin, discuss the benefits and risks with your health care provider. What is my risk for heart disease and stroke? The risk of heart disease, heart attack, and stroke increases as you age. One of the causes may be a change in the body's hormones during menopause. This can affect how your body uses dietary fats, triglycerides, and cholesterol. Heart attack and stroke are medical emergencies. There are many things that you can do to help prevent heart disease and stroke. Watch your blood pressure  High blood pressure causes heart disease and increases the risk of stroke. This is more likely to develop in people who have high blood pressure readings, are of African  descent, or are overweight.  Have your blood pressure checked: ? Every 3-5 years if you are 21-6 years of age. ? Every year if you are 47 years old or older. Eat a healthy diet   Eat a diet that includes plenty of vegetables, fruits, low-fat dairy products, and lean protein.  Do not eat a lot of foods that are high in solid fats, added sugars, or sodium. Get regular exercise Get regular exercise. This is one of the most important things you can do for your health. Most adults should:  Try to exercise for at least 150 minutes  each week. The exercise should increase your heart rate and make you sweat (moderate-intensity exercise).  Try to do strengthening exercises at least twice each week. Do these in addition to the moderate-intensity exercise.  Spend less time sitting. Even light physical activity can be beneficial. Other tips  Work with your health care provider to achieve or maintain a healthy weight.  Do not use any products that contain nicotine or tobacco, such as cigarettes, e-cigarettes, and chewing tobacco. If you need help quitting, ask your health care provider.  Know your numbers. Ask your health care provider to check your cholesterol and your blood sugar (glucose). Continue to have your blood tested as directed by your health care provider. Do I need screening for cancer? Depending on your health history and family history, you may need to have cancer screening at different stages of your life. This may include screening for:  Breast cancer.  Cervical cancer.  Lung cancer.  Colorectal cancer. What is my risk for osteoporosis? After menopause, you may be at increased risk for osteoporosis. Osteoporosis is a condition in which bone destruction happens more quickly than new bone creation. To help prevent osteoporosis or the bone fractures that can happen because of osteoporosis, you may take the following actions:  If you are 45-14 years old, get at least 1,000 mg of calcium and at least 600 mg of vitamin D per day.  If you are older than age 48 but younger than age 28, get at least 1,200 mg of calcium and at least 600 mg of vitamin D per day.  If you are older than age 92, get at least 1,200 mg of calcium and at least 800 mg of vitamin D per day. Smoking and drinking excessive alcohol increase the risk of osteoporosis. Eat foods that are rich in calcium and vitamin D, and do weight-bearing exercises several times each week as directed by your health care provider. How does menopause affect my  mental health? Depression may occur at any age, but it is more common as you become older. Common symptoms of depression include:  Low or sad mood.  Changes in sleep patterns.  Changes in appetite or eating patterns.  Feeling an overall lack of motivation or enjoyment of activities that you previously enjoyed.  Frequent crying spells. Talk with your health care provider if you think that you are experiencing depression. General instructions See your health care provider for regular wellness exams and vaccines. This may include:  Scheduling regular health, dental, and eye exams.  Getting and maintaining your vaccines. These include: ? Influenza vaccine. Get this vaccine each year before the flu season begins. ? Pneumonia vaccine. ? Shingles vaccine. ? Tetanus, diphtheria, and pertussis (Tdap) booster vaccine. Your health care provider may also recommend other immunizations. Tell your health care provider if you have ever been abused or do not feel safe at home. Summary  Menopause is a normal process in which your ability to get pregnant comes to an end.  This condition causes hot flashes, night sweats, decreased interest in sex, mood swings, headaches, or lack of sleep.  Treatment for this condition may include hormone replacement therapy.  Take actions to keep yourself healthy, including exercising regularly, eating a healthy diet, watching your weight, and checking your blood pressure and blood sugar levels.  Get screened for cancer and depression. Make sure that you are up to date with all your vaccines. This information is not intended to replace advice given to you by your health care provider. Make sure you discuss any questions you have with your health care provider. Document Released: 02/19/2005 Document Revised: 12/21/2017 Document Reviewed: 12/21/2017 Elsevier Patient Education  2020 [employer].

## 2018-11-01 NOTE — Patient Instructions (Addendum)
   If you have lab work done today you will be contacted with your lab results within the next 2 weeks.  If you have not heard from us then please contact us. The fastest way to get your results is to register for My Chart.   IF you received an x-ray today, you will receive an invoice from [facility]. Please contact [facility] at [phone] with questions or concerns regarding your invoice.   IF you received labwork today, you will receive an invoice from [facility]. Please contact [facility] at [phone] with questions or concerns regarding your invoice.   Our billing staff will not be able to assist you with questions regarding bills from these companies.  You will be contacted with the lab results as soon as they are available. The fastest way to get your results is to activate your My Chart account. Instructions are located on the last page of this paperwork. If you have not heard from us regarding the results in 2 weeks, please contact this office.     Health Maintenance for Postmenopausal Women Menopause is a normal process in which your ability to get pregnant comes to an end. This process happens slowly over many months or years, usually between the ages of 48 and 55. Menopause is complete when you have missed your menstrual periods for 12 months. It is important to talk with your health care provider about some of the most common conditions that affect women after menopause (postmenopausal women). These include heart disease, cancer, and bone loss (osteoporosis). Adopting a healthy lifestyle and getting preventive care can help to promote your health and wellness. The actions you take can also lower your chances of developing some of these common conditions. What should I know about menopause? During menopause, you may get a number of symptoms, such as:  Hot flashes. These can be moderate or severe.  Night sweats.  Decrease in sex drive.  Mood  swings.  Headaches.  Tiredness.  Irritability.  Memory problems.  Insomnia. Choosing to treat or not to treat these symptoms is a decision that you make with your health care provider. Do I need hormone replacement therapy?  Hormone replacement therapy is effective in treating symptoms that are caused by menopause, such as hot flashes and night sweats.  Hormone replacement carries certain risks, especially as you become older. If you are thinking about using estrogen or estrogen with progestin, discuss the benefits and risks with your health care provider. What is my risk for heart disease and stroke? The risk of heart disease, heart attack, and stroke increases as you age. One of the causes may be a change in the body's hormones during menopause. This can affect how your body uses dietary fats, triglycerides, and cholesterol. Heart attack and stroke are medical emergencies. There are many things that you can do to help prevent heart disease and stroke. Watch your blood pressure  High blood pressure causes heart disease and increases the risk of stroke. This is more likely to develop in people who have high blood pressure readings, are of African descent, or are overweight.  Have your blood pressure checked: ? Every 3-5 years if you are 18-39 years of age. ? Every year if you are 40 years old or older. Eat a healthy diet   Eat a diet that includes plenty of vegetables, fruits, low-fat dairy products, and lean protein.  Do not eat a lot of foods that are high in solid fats, added sugars, or   sodium. Get regular exercise Get regular exercise. This is one of the most important things you can do for your health. Most adults should:  Try to exercise for at least 150 minutes each week. The exercise should increase your heart rate and make you sweat (moderate-intensity exercise).  Try to do strengthening exercises at least twice each week. Do these in addition to the moderate-intensity  exercise.  Spend less time sitting. Even light physical activity can be beneficial. Other tips  Work with your health care provider to achieve or maintain a healthy weight.  Do not use any products that contain nicotine or tobacco, such as cigarettes, e-cigarettes, and chewing tobacco. If you need help quitting, ask your health care provider.  Know your numbers. Ask your health care provider to check your cholesterol and your blood sugar (glucose). Continue to have your blood tested as directed by your health care provider. Do I need screening for cancer? Depending on your health history and family history, you may need to have cancer screening at different stages of your life. This may include screening for:  Breast cancer.  Cervical cancer.  Lung cancer.  Colorectal cancer. What is my risk for osteoporosis? After menopause, you may be at increased risk for osteoporosis. Osteoporosis is a condition in which bone destruction happens more quickly than new bone creation. To help prevent osteoporosis or the bone fractures that can happen because of osteoporosis, you may take the following actions:  If you are 19-50 years old, get at least 1,000 mg of calcium and at least 600 mg of vitamin D per day.  If you are older than age 50 but younger than age 70, get at least 1,200 mg of calcium and at least 600 mg of vitamin D per day.  If you are older than age 70, get at least 1,200 mg of calcium and at least 800 mg of vitamin D per day. Smoking and drinking excessive alcohol increase the risk of osteoporosis. Eat foods that are rich in calcium and vitamin D, and do weight-bearing exercises several times each week as directed by your health care provider. How does menopause affect my mental health? Depression may occur at any age, but it is more common as you become older. Common symptoms of depression include:  Low or sad mood.  Changes in sleep patterns.  Changes in appetite or eating  patterns.  Feeling an overall lack of motivation or enjoyment of activities that you previously enjoyed.  Frequent crying spells. Talk with your health care provider if you think that you are experiencing depression. General instructions See your health care provider for regular wellness exams and vaccines. This may include:  Scheduling regular health, dental, and eye exams.  Getting and maintaining your vaccines. These include: ? Influenza vaccine. Get this vaccine each year before the flu season begins. ? Pneumonia vaccine. ? Shingles vaccine. ? Tetanus, diphtheria, and pertussis (Tdap) booster vaccine. Your health care provider may also recommend other immunizations. Tell your health care provider if you have ever been abused or do not feel safe at home. Summary  Menopause is a normal process in which your ability to get pregnant comes to an end.  This condition causes hot flashes, night sweats, decreased interest in sex, mood swings, headaches, or lack of sleep.  Treatment for this condition may include hormone replacement therapy.  Take actions to keep yourself healthy, including exercising regularly, eating a healthy diet, watching your weight, and checking your blood pressure and blood sugar   levels.  Get screened for cancer and depression. Make sure that you are up to date with all your vaccines. This information is not intended to replace advice given to you by your health care provider. Make sure you discuss any questions you have with your health care provider. Document Released: 02/19/2005 Document Revised: 12/21/2017 Document Reviewed: 12/21/2017 Elsevier Patient Education  2020 Elsevier Inc.  

## 2018-11-02 LAB — CMP14+EGFR
ALT: 85 IU/L — ABNORMAL HIGH (ref 0–32)
AST: 53 IU/L — ABNORMAL HIGH (ref 0–40)
Albumin/Globulin Ratio: 1.6 (ref 1.2–2.2)
Albumin: 4.4 g/dL (ref 3.8–4.9)
Alkaline Phosphatase: 75 IU/L (ref 39–117)
BUN/Creatinine Ratio: 22 (ref 9–23)
BUN: 15 mg/dL (ref 6–24)
Bilirubin Total: 0.9 mg/dL (ref 0.0–1.2)
CO2: 22 mmol/L (ref 20–29)
Calcium: 9.8 mg/dL (ref 8.7–10.2)
Chloride: 101 mmol/L (ref 96–106)
Creatinine, Ser: 0.67 mg/dL (ref 0.57–1.00)
GFR calc Af Amer: 117 mL/min/{1.73_m2} (ref >59)
GFR calc non Af Amer: 101 mL/min/{1.73_m2} (ref >59)
Globulin, Total: 2.7 g/dL (ref 1.5–4.5)
Glucose: 107 mg/dL — ABNORMAL HIGH (ref 65–99)
Potassium: 4.2 mmol/L (ref 3.5–5.2)
Sodium: 140 mmol/L (ref 134–144)
Total Protein: 7.1 g/dL (ref 6.0–8.5)

## 2018-11-02 LAB — LIPID PANEL
Chol/HDL Ratio: 3.4 ratio (ref 0.0–4.4)
Cholesterol, Total: 183 mg/dL (ref 100–199)
HDL: 54 mg/dL (ref >39)
LDL Chol Calc (NIH): 106 mg/dL — ABNORMAL HIGH (ref 0–99)
Triglycerides: 130 mg/dL (ref 0–149)
VLDL Cholesterol Cal: 23 mg/dL (ref 5–40)

## 2018-11-21 ENCOUNTER — Encounter: Payer: Self-pay | Admitting: Family Medicine

## 2018-11-21 NOTE — Progress Notes (Unsigned)
Mammo- neg for malignancy Pap- HPV- negative

## 2019-01-10 ENCOUNTER — Encounter: Payer: Self-pay | Admitting: Family Medicine

## 2019-01-30 DIAGNOSIS — I10 Essential (primary) hypertension: Secondary | ICD-10-CM | POA: Insufficient documentation

## 2019-07-31 ENCOUNTER — Other Ambulatory Visit: Payer: Self-pay | Admitting: Obstetrics and Gynecology

## 2019-07-31 DIAGNOSIS — Z9189 Other specified personal risk factors, not elsewhere classified: Secondary | ICD-10-CM

## 2019-09-12 ENCOUNTER — Ambulatory Visit
Admission: RE | Admit: 2019-09-12 | Discharge: 2019-09-12 | Disposition: A | Discharge status: Home / Self Care | Payer: 59 | Source: Ambulatory Visit | Attending: Obstetrics and Gynecology | Admitting: Obstetrics and Gynecology

## 2019-09-12 ENCOUNTER — Other Ambulatory Visit: Payer: Self-pay

## 2019-09-12 ENCOUNTER — Other Ambulatory Visit: Payer: BLUE CROSS/BLUE SHIELD

## 2019-09-12 DIAGNOSIS — Z9189 Other specified personal risk factors, not elsewhere classified: Secondary | ICD-10-CM

## 2019-09-12 MED ORDER — GADOBUTROL 1 MMOL/ML IV SOLN
10.0000 mL | Freq: Once | INTRAVENOUS | Status: AC | PRN
  Administered 2019-09-12: 10 mL via INTRAVENOUS

## 2019-11-08 ENCOUNTER — Telehealth: Payer: Self-pay

## 2019-11-08 DIAGNOSIS — Z760 Encounter for issue of repeat prescription: Secondary | ICD-10-CM

## 2019-11-08 MED ORDER — HYDROCHLOROTHIAZIDE 25 MG PO TABS: 25.0000 mg | ORAL_TABLET | Freq: Every day | ORAL | 0 refills | Status: DC

## 2019-11-08 NOTE — Telephone Encounter (Signed)
Pt. Called requesting refill on hydrochlorothiazide. Pt. Has appt. With [place] on 12/20/2019

## 2019-11-08 NOTE — Telephone Encounter (Signed)
Refill sent in to get pt to his appt.

## 2019-12-11 ENCOUNTER — Other Ambulatory Visit: Payer: Self-pay | Admitting: Family Medicine

## 2019-12-11 DIAGNOSIS — Z760 Encounter for issue of repeat prescription: Secondary | ICD-10-CM

## 2019-12-12 NOTE — Telephone Encounter (Addendum)
12/12/2019 - PATIENT IS CALLING TO LET us KNOW SHE DROPPED HER HYDROCHLOROTHIAZIDE PILLS IN THE SINK SO THEY GOT WET. SHE THINKS THEY GOT CONTAMINATED BECAUSE IT FEELS LIKE HER BLOOD PRESSURE IS HIGH NOW. SHE HAS A PHYSICAL SCHEDULED WITH DR. HOPPER ON Thursday 12/20/2019. SHE WOULD LIKE JUST A WEEK'S WORTH UNTIL HER PHYSICAL. TOC SCHEDULED WITH [facility] JUST ON 03/12/20. FORMER DR. Nolon Rod PATIENT PATIENT'S PHONE [phone]   [place] [address]  [place]

## 2019-12-20 ENCOUNTER — Other Ambulatory Visit: Payer: Self-pay

## 2019-12-20 ENCOUNTER — Encounter: Payer: Self-pay | Admitting: Family Medicine

## 2019-12-20 ENCOUNTER — Ambulatory Visit (INDEPENDENT_AMBULATORY_CARE_PROVIDER_SITE_OTHER): Payer: 59 | Admitting: Family Medicine

## 2019-12-20 VITALS — BP 141/96 | HR 87 | Temp 98.0°F | Resp 16 | Ht 63.5 in | Wt 181.0 lb

## 2019-12-20 DIAGNOSIS — Z0000 Encounter for general adult medical examination without abnormal findings: Secondary | ICD-10-CM

## 2019-12-20 DIAGNOSIS — R635 Abnormal weight gain: Secondary | ICD-10-CM | POA: Diagnosis not present

## 2019-12-20 DIAGNOSIS — I10 Essential (primary) hypertension: Secondary | ICD-10-CM

## 2019-12-20 DIAGNOSIS — F411 Generalized anxiety disorder: Secondary | ICD-10-CM

## 2019-12-20 DIAGNOSIS — Z0001 Encounter for general adult medical examination with abnormal findings: Secondary | ICD-10-CM

## 2019-12-20 MED ORDER — LOSARTAN POTASSIUM-HCTZ 50-12.5 MG PO TABS: 1.0000 | ORAL_TABLET | Freq: Every day | ORAL | 3 refills | Status: DC

## 2019-12-20 NOTE — Progress Notes (Signed)
Patient ID: Kaylee Lopez, female    DOB: January 01, 1966  Age: 54 y.o. MRN: [id number]  Chief Complaint  Patient presents with  . Annual Exam    Pt had dropped bp med in sink by accident readded them to bottle not realizing they were wet and she continued taking them then reports she started having high readings as well as headaches and "not feeling quite right". Pt got pills replaced and has been taking those about one week now but still feels off. Otherwise no concerns  Pt GAD 7 score is a 15 and she is reported to have a history of anxiety     Subjective:   Annual physical examination:  54 year old lady who is here for a health maintenance examination.  She has been doing well with the exception of her blood pressures which have been running high.  She brought in a sheet with some of her readings from September until now.  She has had many readings in the 1 teens to 140s over 75-1 05.  Most of the readings are around 140/90.  She is concerned about this.  She has had some headaches.  She dropped her pills in the sake and wondered whether they got messed up and contaminated.  She does worry a lot.  She worries about her job, her life, her family, her children in college, her health.  She walks her dog twice daily for exercise.    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 History of hypertension History of vertigo and right-sided pulsatile tinnitus History of esophageal stricture treated with a dilatation that is done well. Menopausal Gravida 2 para 2 Allergies: None known Medications: Hydrochlorothiazide 25 mg daily  Family history: Father is 53, has A. fib, otherwise well., otherwise well.  Mother has chronic kidney disease and CHF, is also 53. and CHF, is also 29.  There is a family history for breast cancer.  Due to the family history her gynecologist has done extensive close screening of her breasts.  Social history: Married for 30 years,  2 children in college, one at [employer].  Being an empty Ashok Cordia is not always been easy for her.  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  She enjoys the patient contact, does not like the bureaucracy and paperwork.  Was raised Catholic but is not active in [employer].  She was raised in the [place] and has lived down here since her husband went to graduate school at [employer].  She does not smoke.  She does drink 4 to 5 days a week, 2 or 3 glasses of wine a time.  Uses this to calm her anxieties.  Does not use any drugs.  Review of systems: Constitutional: Unremarkable HEENT: The unremarkable except from.  Tenderness is noted above Respiratory: Unremarkable Cardiovascular: Unremarkable GI: The history of esophageal stricture, otherwise unremarkable GEN: Unremarkable Musculoskeletal: Unremarkable Neurologic: Unremarkable Dermatologic: Sees a dermatologist yearly Psychiatric: Chronic anxiety    Current allergies, medications, problem list, past/family and social histories reviewed.  Objective:  BP (!) 141/96   Pulse 87   Temp 98 F (36.7 C) (Temporal)   Resp 16   Ht 5' 3.5" (1.613 m)   Wt 181 lb (82.1 kg)   SpO2 100%   BMI 31.56 kg/m  Pleasant lady, alert and oriented.  Got a little teary a couple of times.  TMs normal.  Eyes PERRL.  Throat clear and teeth good.  Neck supple without nodes or thyromegaly.  No carotid bruits.  Chest is clear to auscultation.  Heart rate without murmurs.  Abdomen soft without masses or tenderness.  No axillary or inguinal nodes.  Breast and pelvic exam not done today.  Extremities unremarkable.  Skin normal.  Assessment & Plan:   Assessment: 1. Annual physical exam   2. Anxiety state   3. Weight gain   4. Essential hypertension       Plan:  Had a long talk with her which she seemed to appreciate.  See instructions.  We will make a change in her blood pressure medicine.  Explained that to her. Orders Placed This  Encounter  Procedures  . CBC  . Comprehensive metabolic panel  . TSH  . Lipid panel    Meds ordered this encounter  Medications  . losartan-hydrochlorothiazide (HYZAAR) 50-12.5 MG tablet    Sig: Take 1 tablet by mouth daily.    Dispense:  90 tablet    Refill:  3         Patient Instructions    Discontinue the hydrochlorothiazide  Begin taking losartan/hydrochlorothiazide 50/12.5 mg 1 daily.  This is a low dose of losartan, and we can go upwards if needed.  I would hope that we can get your blood pressure consistently staying below 140/90, and ideally closer to 120/70.  Work hard on weight loss watching your oral intake and increasing your exercise.  I believe that exercise can help your anxiety also.  If you decide you would like to try other medication for the anxiety let us know.  Counseling is also so an option at any time.  I think that may be rekindling your spiritual background would help you also.  A physician friend of mine is written a number good books that I could recommend, but one of them is IT sales professional for the [employer] and can be found on [employer] inexpensively.  It is up to you if you decide to take the [place] John's wort.  I am not terribly impressed with it.  Monitor your blood pressures at home and if you have concerns that they are running too high come in sooner.  Return in about 6 months to monitor your blood pressure.  Sooner if needed.   If you have lab work done today you will be contacted with your lab results within the next 2 weeks.  If you have not heard from [place] then please contact us. The fastest way to get your results is to register for My Chart.   IF you received an x-ray today, you will receive an invoice from [facility]. Please contact [facility] at [phone] with questions or concerns regarding your invoice.   IF you received labwork today, you will receive an invoice from [facility]. Please contact [facility]  at [phone] with questions or concerns regarding your invoice.   Our billing staff will not be able to assist you with questions regarding bills from these companies.  You will be contacted with the lab results as soon as they are available. The fastest way to get your results is to activate your My Chart account. Instructions are located on the last page of this paperwork. If you have not heard from [place] regarding the results in 2 weeks, please contact this office.        Return in about 6 months (around 06/19/2020) for Hypertension follow-up.   Ruben Reason, MD 12/20/2019

## 2019-12-20 NOTE — Progress Notes (Signed)
Patient ID: Kaylee Lopez, female    DOB: 1965-07-06  Age: 54 y.o. MRN: [id number]  Chief Complaint  Patient presents with   Annual Exam    Subjective:     Current allergies, medications, problem list, past/family and social histories reviewed.  Objective:  BP 130/79   Pulse 82   Temp 97.9 F (36.6 C)   Ht 5' 3.35" (1.609 m)   Wt 176 lb 12.8 oz (80.2 kg)   SpO2 97%   BMI 30.97 kg/m    Assessment & Plan:   Assessment: 1. Encounter for health maintenance examination in adult   2. Essential hypertension   3. Encounter for medication refill       Plan:   Orders Placed This Encounter  Procedures   Lipid panel   CMP14+EGFR    Meds ordered this encounter  Medications   cetirizine (ZYRTEC) 10 MG tablet    Sig: Take 1 tablet (10 mg total) by mouth daily.    Dispense:  90 tablet    Refill:  3   fluticasone (FLONASE) 50 MCG/ACT nasal spray    Sig: SHAKE LIQUID AND USE 2 SPRAYS IN EACH NOSTRIL AT BEDTIME    Dispense:  16 g    Refill:  11   DISCONTD: hydrochlorothiazide (HYDRODIURIL) 25 MG tablet    Sig: Take 1 tablet (25 mg total) by mouth daily.    Dispense:  90 tablet    Refill:  3         Patient Instructions       If you have lab work done today you will be contacted with your lab results within the next 2 weeks.  If you have not heard from [place] then please contact us. The fastest way to get your results is to register for My Chart.   IF you received an x-ray today, you will receive an invoice from [facility]. Please contact [facility] at [phone] with questions or concerns regarding your invoice.   IF you received labwork today, you will receive an invoice from [place]. Please contact [facility] at [phone] with questions or concerns regarding your invoice.   Our billing staff will not be able to assist you with questions regarding bills from these companies.  You will be contacted with the lab results as soon as they  are available. The fastest way to get your results is to activate your My Chart account. Instructions are located on the last page of this paperwork. If you have not heard from [place] regarding the results in 2 weeks, please contact this office.     Health Maintenance for Postmenopausal Women Menopause is a normal process in which your ability to get pregnant comes to an end. This process happens slowly over many months or years, usually between the ages of 37 and 66. Menopause is complete when you have missed your menstrual periods for 12 months. It is important to talk with your health care provider about some of the most common conditions that affect women after menopause (postmenopausal women). These include heart disease, cancer, and bone loss (osteoporosis). Adopting a healthy lifestyle and getting preventive care can help to promote your health and wellness. The actions you take can also lower your chances of developing some of these common conditions. What should I know about menopause? During menopause, you may get a number of symptoms, such as: Hot flashes. These can be moderate or severe. Night sweats. Decrease in sex drive. Mood swings. Headaches. Tiredness. Irritability. Memory problems. Insomnia.  Choosing to treat or not to treat these symptoms is a decision that you make with your health care provider. Do I need hormone replacement therapy? Hormone replacement therapy is effective in treating symptoms that are caused by menopause, such as hot flashes and night sweats. Hormone replacement carries certain risks, especially as you become older. If you are thinking about using estrogen or estrogen with progestin, discuss the benefits and risks with your health care provider. What is my risk for heart disease and stroke? The risk of heart disease, heart attack, and stroke increases as you age. One of the causes may be a change in the body's hormones during menopause. This can affect how  your body uses dietary fats, triglycerides, and cholesterol. Heart attack and stroke are medical emergencies. There are many things that you can do to help prevent heart disease and stroke. Watch your blood pressure High blood pressure causes heart disease and increases the risk of stroke. This is more likely to develop in people who have high blood pressure readings, are of African descent, or are overweight. Have your blood pressure checked: Every 3-5 years if you are 4-68 years of age. Every year if you are 12 years old or older. Eat a healthy diet  Eat a diet that includes plenty of vegetables, fruits, low-fat dairy products, and lean protein. Do not eat a lot of foods that are high in solid fats, added sugars, or sodium. Get regular exercise Get regular exercise. This is one of the most important things you can do for your health. Most adults should: Try to exercise for at least 150 minutes each week. The exercise should increase your heart rate and make you sweat (moderate-intensity exercise). Try to do strengthening exercises at least twice each week. Do these in addition to the moderate-intensity exercise. Spend less time sitting. Even light physical activity can be beneficial. Other tips Work with your health care provider to achieve or maintain a healthy weight. Do not use any products that contain nicotine or tobacco, such as cigarettes, e-cigarettes, and chewing tobacco. If you need help quitting, ask your health care provider. Know your numbers. Ask your health care provider to check your cholesterol and your blood sugar (glucose). Continue to have your blood tested as directed by your health care provider. Do I need screening for cancer? Depending on your health history and family history, you may need to have cancer screening at different stages of your life. This may include screening for: Breast cancer. Cervical cancer. Lung cancer. Colorectal cancer. What is my risk for  osteoporosis? After menopause, you may be at increased risk for osteoporosis. Osteoporosis is a condition in which bone destruction happens more quickly than new bone creation. To help prevent osteoporosis or the bone fractures that can happen because of osteoporosis, you may take the following actions: If you are 38-51 years old, get at least 1,000 mg of calcium and at least 600 mg of vitamin D per day. If you are older than age 73 but younger than age 67, get at least 1,200 mg of calcium and at least 600 mg of vitamin D per day. If you are older than age 51, get at least 1,200 mg of calcium and at least 800 mg of vitamin D per day. Smoking and drinking excessive alcohol increase the risk of osteoporosis. Eat foods that are rich in calcium and vitamin D, and do weight-bearing exercises several times each week as directed by your health care provider. How does menopause  affect my mental health? Depression may occur at any age, but it is more common as you become older. Common symptoms of depression include: Low or sad mood. Changes in sleep patterns. Changes in appetite or eating patterns. Feeling an overall lack of motivation or enjoyment of activities that you previously enjoyed. Frequent crying spells. Talk with your health care provider if you think that you are experiencing depression. General instructions See your health care provider for regular wellness exams and vaccines. This may include: Scheduling regular health, dental, and eye exams. Getting and maintaining your vaccines. These include: Influenza vaccine. Get this vaccine each year before the flu season begins. Pneumonia vaccine. Shingles vaccine. Tetanus, diphtheria, and pertussis (Tdap) booster vaccine. Your health care provider may also recommend other immunizations. Tell your health care provider if you have ever been abused or do not feel safe at home. Summary Menopause is a normal process in which your ability to get  pregnant comes to an end. This condition causes hot flashes, night sweats, decreased interest in sex, mood swings, headaches, or lack of sleep. Treatment for this condition may include hormone replacement therapy. Take actions to keep yourself healthy, including exercising regularly, eating a healthy diet, watching your weight, and checking your blood pressure and blood sugar levels. Get screened for cancer and depression. Make sure that you are up to date with all your vaccines. This information is not intended to replace advice given to you by your health care provider. Make sure you discuss any questions you have with your health care provider. Document Released: 02/19/2005 Document Revised: 12/21/2017 Document Reviewed: 12/21/2017 Elsevier Patient Education  [place].     No follow-ups on file.   Ruben Reason, MD 12/20/2019

## 2019-12-20 NOTE — Patient Instructions (Addendum)
Discontinue the hydrochlorothiazide  Begin taking losartan/hydrochlorothiazide 50/12.5 mg 1 daily.  This is a low dose of losartan, and we can go upwards if needed.  I would hope that we can get your blood pressure consistently staying below 140/90, and ideally closer to 120/70.  Work hard on weight loss watching your oral intake and increasing your exercise.  I believe that exercise can help your anxiety also.  If you decide you would like to try other medication for the anxiety let us know.  Counseling is also so an option at any time.  I think that may be rekindling your spiritual background would help you also.  A physician friend of mine is written a number good books that I could recommend, but one of them is IT sales professional for the [employer] and can be found on [employer] inexpensively.  It is up to you if you decide to take the [place] John's wort.  I am not terribly impressed with it.  Monitor your blood pressures at home and if you have concerns that they are running too high come in sooner.  Return in about 6 months to monitor your blood pressure.  Sooner if needed.   If you have lab work done today you will be contacted with your lab results within the next 2 weeks.  If you have not heard from [place] then please contact us. The fastest way to get your results is to register for My Chart.   IF you received an x-ray today, you will receive an invoice from [facility]. Please contact [facility] at [phone] with questions or concerns regarding your invoice.   IF you received labwork today, you will receive an invoice from [facility]. Please contact [facility] at [phone] with questions or concerns regarding your invoice.   Our billing staff will not be able to assist you with questions regarding bills from these companies.  You will be contacted with the lab results as soon as they are available. The fastest way to get your results is to activate your My Chart  account. Instructions are located on the last page of this paperwork. If you have not heard from [place] regarding the results in 2 weeks, please contact this office.

## 2019-12-21 LAB — CBC
Hematocrit: 45.7 % (ref 34.0–46.6)
Hemoglobin: 15.4 g/dL (ref 11.1–15.9)
MCH: 31.4 pg (ref 26.6–33.0)
MCHC: 33.7 g/dL (ref 31.5–35.7)
MCV: 93 fL (ref 79–97)
Platelets: 247 10*3/uL (ref 150–450)
RBC: 4.9 x10E6/uL (ref 3.77–5.28)
RDW: 12.2 % (ref 11.7–15.4)
WBC: 8.2 10*3/uL (ref 3.4–10.8)

## 2019-12-21 LAB — COMPREHENSIVE METABOLIC PANEL
ALT: 57 IU/L — ABNORMAL HIGH (ref 0–32)
AST: 38 IU/L (ref 0–40)
Albumin/Globulin Ratio: 1.8 (ref 1.2–2.2)
Albumin: 4.8 g/dL (ref 3.8–4.9)
Alkaline Phosphatase: 83 IU/L (ref 44–121)
BUN/Creatinine Ratio: 22 (ref 9–23)
BUN: 13 mg/dL (ref 6–24)
Bilirubin Total: 0.7 mg/dL (ref 0.0–1.2)
CO2: 25 mmol/L (ref 20–29)
Calcium: 10 mg/dL (ref 8.7–10.2)
Chloride: 97 mmol/L (ref 96–106)
Creatinine, Ser: 0.59 mg/dL (ref 0.57–1.00)
GFR calc Af Amer: 121 mL/min/{1.73_m2} (ref >59)
GFR calc non Af Amer: 105 mL/min/{1.73_m2} (ref >59)
Globulin, Total: 2.7 g/dL (ref 1.5–4.5)
Glucose: 81 mg/dL (ref 65–99)
Potassium: 3.7 mmol/L (ref 3.5–5.2)
Sodium: 141 mmol/L (ref 134–144)
Total Protein: 7.5 g/dL (ref 6.0–8.5)

## 2019-12-21 LAB — LIPID PANEL
Chol/HDL Ratio: 3.8 ratio (ref 0.0–4.4)
Cholesterol, Total: 218 mg/dL — ABNORMAL HIGH (ref 100–199)
HDL: 57 mg/dL (ref >39)
LDL Chol Calc (NIH): 139 mg/dL — ABNORMAL HIGH (ref 0–99)
Triglycerides: 126 mg/dL (ref 0–149)
VLDL Cholesterol Cal: 22 mg/dL (ref 5–40)

## 2019-12-21 LAB — TSH: TSH: 1.19 u[IU]/mL (ref 0.450–4.500)

## 2020-03-12 ENCOUNTER — Encounter: Payer: Self-pay | Admitting: Family Medicine

## 2020-03-12 ENCOUNTER — Ambulatory Visit (INDEPENDENT_AMBULATORY_CARE_PROVIDER_SITE_OTHER): Payer: 59 | Admitting: Family Medicine

## 2020-03-12 ENCOUNTER — Other Ambulatory Visit: Payer: Self-pay

## 2020-03-12 VITALS — BP 131/91 | HR 80 | Temp 97.6°F | Ht 63.5 in | Wt 178.0 lb

## 2020-03-12 DIAGNOSIS — Z23 Encounter for immunization: Secondary | ICD-10-CM | POA: Diagnosis not present

## 2020-03-12 DIAGNOSIS — I10 Essential (primary) hypertension: Secondary | ICD-10-CM

## 2020-03-12 DIAGNOSIS — T7840XD Allergy, unspecified, subsequent encounter: Secondary | ICD-10-CM

## 2020-03-12 NOTE — Progress Notes (Signed)
3/2/20229:10 AM  Kaylee Lopez [birth year], 55 y.o., female [id number]  Chief Complaint  Patient presents with  . Hypertension    HPI:   Patient is a 55 y.o. female with past medical history significant for HTN, allergies who presents today for toc.  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   HTN Takes BP at home Monitors this on her app For BP goal < 130/80 Losartan/HCTZ 50/12.5 BP Readings from Last 3 Encounters:  03/12/20 (!) 131/91  12/20/19 (!) 141/96  11/01/18 130/79   HLD Lab Results  Component Value Date   CHOL 218 (H) 12/20/2019   HDL 57 12/20/2019   LDLCALC 139 (H) 12/20/2019   TRIG 126 12/20/2019   CHOLHDL 3.8 12/20/2019   The 10-year ASCVD risk score Mikey Bussing DC Jr., et al., 2013) is: 2.5%   Values used to calculate the score:     Age: 65 years     Sex: Female     Is Non-Hispanic African American: No     Diabetic: No     Tobacco smoker: No     Systolic Blood Pressure: 269 mmHg     Is BP treated: Yes     HDL Cholesterol: 57 mg/dL     Total Cholesterol: 218 mg/dL  Last CPE 12/20/19 Menopausal Takes Allergy medications as needed    Health Maintenance  Topic Date Due  . Hepatitis C Screening  12/19/2020 (Originally 1965-04-16)  . PAP SMEAR-Modifier  01/17/2021  . COLONOSCOPY (Pts 45-30yrs Insurance coverage will need to be confirmed)  07/21/2021  . MAMMOGRAM  03/05/2022  . TETANUS/TDAP  03/13/2030  . INFLUENZA VACCINE  Completed  . COVID-19 Vaccine  Completed  . HIV Screening  Completed  . HPV VACCINES  Aged Out     Depression screen [facility] 2/9 03/12/2020 11/01/2018 09/07/2017  Decreased Interest 0 0 0  Down, Depressed, Hopeless 0 0 0  PHQ - 2 Score 0 0 0    Fall Risk  11/01/2018 09/07/2017 09/07/2017 08/18/2016 04/29/2016  Falls in the past year? 0 No No No No  Number falls in past yr: 0 - - - -   Injury with Fall? 0 - - - -     No Known Allergies  Prior to Admission medications   Medication Sig Start Date End Date Taking? Authorizing Provider  Calcium Citrate-Vitamin D (CITRACAL MAXIMUM PO) Take by mouth.   Yes [provider]  losartan-hydrochlorothiazide (HYZAAR) 50-12.5 MG tablet Take 1 tablet by mouth daily. 12/20/19  Yes Posey Boyer, MD  cetirizine (ZYRTEC) 10 MG tablet Take 1 tablet (10 mg total) by mouth daily. Patient not taking: Reported on 03/12/2020 11/01/18   Forrest Moron, MD  fluticasone (FLONASE) 50 MCG/ACT nasal spray SHAKE LIQUID AND USE 2 SPRAYS IN EACH NOSTRIL AT BEDTIME Patient not taking: Reported on 03/12/2020 11/01/18   Forrest Moron, MD  olopatadine (PATANOL) 0.1 % ophthalmic solution Place 1 drop into both eyes 2 (two) times daily. Patient not taking: Reported on 03/12/2020 09/07/17   Shawnee Knapp, MD  polyethylene glycol powder (GLYCOLAX/MIRALAX) powder Take 17 g by mouth 2 (two) times daily as needed. Patient not taking: Reported on 03/12/2020 08/05/14   Araceli Bouche, PA    Past Medical History:  Diagnosis Date  . Allergy   . Anxiety   . Bacterial  meningitis   . GERD (gastroesophageal reflux disease)   . HTN (hypertension)   . Hypertension    Phreesia 12/18/2019    Past Surgical History:  Procedure Laterality Date  . NO PAST SURGERIES      Social History   Tobacco Use  . Smoking status: Never Smoker  . Smokeless tobacco: Never Used  Substance Use Topics  . Alcohol use: Yes    Alcohol/week: 0.0 standard drinks    Comment: pt will have 2-3 drinks 4 times a week on average     Family History  Problem Relation Age of Onset  . Heart disease Mother   . Hypertension Mother   . Hyperlipidemia Father   . Prostate cancer Father   . Colon polyps Father        Pro b n peptide 281  . Breast cancer Maternal Grandmother   . Heart disease Maternal Grandfather   . Hypertension Maternal Grandfather   . Kidney disease Maternal  Grandfather   . Stroke Paternal Grandmother   . Breast cancer Paternal Aunt   . Breast cancer Paternal Aunt   . Kidney disease Maternal Aunt   . Diabetes Maternal Aunt     Review of Systems  Constitutional: Negative for chills, fever and malaise/fatigue.  Eyes: Negative for blurred vision and double vision.  Respiratory: Negative for cough, shortness of breath and wheezing.   Cardiovascular: Negative for chest pain, palpitations and leg swelling.  Gastrointestinal: Positive for constipation (monthly issue, uses miralax). Negative for abdominal pain, blood in stool, diarrhea, heartburn, nausea and vomiting.  Genitourinary: Negative for dysuria, frequency, hematuria and urgency.  Musculoskeletal: Negative for back pain and joint pain.  Skin: Negative for rash.  Neurological: Negative for dizziness, weakness and headaches.     OBJECTIVE:  Today's Vitals   03/12/20 0801  BP: (!) 131/91  Pulse: 80  Temp: 97.6 F (36.4 C)  SpO2: 97%  Weight: 178 lb (80.7 kg)  Height: 5' 3.5" (1.613 m)   Body mass index is 31.04 kg/m.   Physical Exam Constitutional:      General: She is not in acute distress.    Appearance: Normal appearance. She is not ill-appearing.  HENT:     Head: Normocephalic.  Cardiovascular:     Rate and Rhythm: Normal rate and regular rhythm.     Pulses: Normal pulses.     Heart sounds: Normal heart sounds. No murmur heard. No friction rub. No gallop.   Pulmonary:     Effort: Pulmonary effort is normal. No respiratory distress.     Breath sounds: Normal breath sounds. No stridor. No wheezing, rhonchi or rales.  Abdominal:     General: Bowel sounds are normal.     Palpations: Abdomen is soft.     Tenderness: There is no abdominal tenderness.  Musculoskeletal:     Right lower leg: No edema.     Left lower leg: No edema.  Skin:    General: Skin is warm and dry.  Neurological:     Mental Status: She is alert and oriented to person, place, and time.   Psychiatric:        Mood and Affect: Mood normal.        Behavior: Behavior normal.     No results found for this or any previous visit (from the past 24 hour(s)).  No results found.   ASSESSMENT and PLAN  Problem List Items Addressed This Visit   None   Visit Diagnoses    Encounter for  vaccination    -  Primary   Relevant Orders   Tdap vaccine greater than or equal to 7yo IM (Completed)   Essential hypertension       Allergy, subsequent encounter           Plan . Continue to monitor BP for goal < 130/80 . Chronic conditions stable on current regimens   Return in about 6 months (around 09/12/2020).    Huston Foley Raelee Rossmann, FNP-BC [facility] [place], [place] [address] Ph.  [phone] Fax [phone]

## 2020-03-12 NOTE — Patient Instructions (Addendum)
  Health Maintenance After Age 54 After age 54, you are at a higher risk for certain long-term diseases and infections as well as injuries from falls. Falls are a major cause of broken bones and head injuries in people who are older than age 54. Getting regular preventive care can help to keep you healthy and well. Preventive care includes getting regular testing and making lifestyle changes as recommended by your health care provider. Talk with your health care provider about:  Which screenings and tests you should have. A screening is a test that checks for a disease when you have no symptoms.  A diet and exercise plan that is right for you. What should I know about screenings and tests to prevent falls? Screening and testing are the best ways to find a health problem early. Early diagnosis and treatment give you the best chance of managing medical conditions that are common after age 54. Certain conditions and lifestyle choices may make you more likely to have a fall. Your health care provider may recommend:  Regular vision checks. Poor vision and conditions such as cataracts can make you more likely to have a fall. If you wear glasses, make sure to get your prescription updated if your vision changes.  Medicine review. Work with your health care provider to regularly review all of the medicines you are taking, including over-the-counter medicines. Ask your health care provider about any side effects that may make you more likely to have a fall. Tell your health care provider if any medicines that you take make you feel dizzy or sleepy.  Osteoporosis screening. Osteoporosis is a condition that causes the bones to get weaker. This can make the bones weak and cause them to break more easily.  Blood pressure screening. Blood pressure changes and medicines to control blood pressure can make you feel dizzy.  Strength and balance checks. Your health care provider may recommend certain tests to  check your strength and balance while standing, walking, or changing positions.  Foot health exam. Foot pain and numbness, as well as not wearing proper footwear, can make you more likely to have a fall.  Depression screening. You may be more likely to have a fall if you have a fear of falling, feel emotionally low, or feel unable to do activities that you used to do.  Alcohol use screening. Using too much alcohol can affect your balance and may make you more likely to have a fall. What actions can I take to lower my risk of falls? General instructions  Talk with your health care provider about your risks for falling. Tell your health care provider if: ? You fall. Be sure to tell your health care provider about all falls, even ones that seem minor. ? You feel dizzy, sleepy, or off-balance.  Take over-the-counter and prescription medicines only as told by your health care provider. These include any supplements.  Eat a healthy diet and maintain a healthy weight. A healthy diet includes low-fat dairy products, low-fat (lean) meats, and fiber from whole grains, beans, and lots of fruits and vegetables. Home safety  Remove any tripping hazards, such as rugs, cords, and clutter.  Install safety equipment such as grab bars in bathrooms and safety rails on stairs.  Keep rooms and walkways well-lit. Activity  Follow a regular exercise program to stay fit. This will help you maintain your balance. Ask your health care provider what types of exercise are appropriate for you.  If you need a cane   or walker, use it as recommended by your health care provider.  Wear supportive shoes that have nonskid soles.   Lifestyle  Do not drink alcohol if your health care provider tells you not to drink.  If you drink alcohol, limit how much you have: ? 0-1 drink a day for women. ? 0-2 drinks a day for men.  Be aware of how much alcohol is in your drink. In the [place] one drink equals one typical bottle  of beer (12 oz), one-half glass of wine (5 oz), or one shot of hard liquor (1 oz).  Do not use any products that contain nicotine or tobacco, such as cigarettes and e-cigarettes. If you need help quitting, ask your health care provider. Summary  Having a healthy lifestyle and getting preventive care can help to protect your health and wellness after age 54.  Screening and testing are the best way to find a health problem early and help you avoid having a fall. Early diagnosis and treatment give you the best chance for managing medical conditions that are more common for people who are older than age 54.  Falls are a major cause of broken bones and head injuries in people who are older than age 54. Take precautions to prevent a fall at home.  Work with your health care provider to learn what changes you can make to improve your health and wellness and to prevent falls. This information is not intended to replace advice given to you by your health care provider. Make sure you discuss any questions you have with your health care provider. Document Revised: 04/20/2018 Document Reviewed: 11/10/2016 Elsevier Patient Education  2021 Elsevier Inc.   If you have lab work done today you will be contacted with your lab results within the next 2 weeks.  If you have not heard from us then please contact us. The fastest way to get your results is to register for My Chart.   IF you received an x-ray today, you will receive an invoice from [facility]. Please contact [facility] at [phone] with questions or concerns regarding your invoice.   IF you received labwork today, you will receive an invoice from [facility]. Please contact [facility] at [phone] with questions or concerns regarding your invoice.   Our billing staff will not be able to assist you with questions regarding bills from these companies.  You will be contacted with the lab results as soon as they are available.  The fastest way to get your results is to activate your My Chart account. Instructions are located on the last page of this paperwork. If you have not heard from us regarding the results in 2 weeks, please contact this office.      

## 2020-03-20 ENCOUNTER — Other Ambulatory Visit: Payer: Self-pay | Admitting: Obstetrics and Gynecology

## 2020-03-20 DIAGNOSIS — Z9189 Other specified personal risk factors, not elsewhere classified: Secondary | ICD-10-CM

## 2020-09-11 ENCOUNTER — Ambulatory Visit: Payer: 59 | Admitting: Family Medicine

## 2020-09-29 ENCOUNTER — Ambulatory Visit
Admission: RE | Admit: 2020-09-29 | Discharge: 2020-09-29 | Disposition: A | Discharge status: Home / Self Care | Payer: 59 | Source: Ambulatory Visit | Attending: Obstetrics and Gynecology | Admitting: Obstetrics and Gynecology

## 2020-09-29 ENCOUNTER — Other Ambulatory Visit: Payer: Self-pay

## 2020-09-29 DIAGNOSIS — Z9189 Other specified personal risk factors, not elsewhere classified: Secondary | ICD-10-CM

## 2020-09-29 MED ORDER — GADOBUTROL 1 MMOL/ML IV SOLN
8.0000 mL | Freq: Once | INTRAVENOUS | Status: AC | PRN
  Administered 2020-09-29: 8 mL via INTRAVENOUS

## 2021-03-24 DIAGNOSIS — J302 Other seasonal allergic rhinitis: Secondary | ICD-10-CM | POA: Insufficient documentation

## 2021-08-19 ENCOUNTER — Encounter: Payer: Self-pay | Admitting: Gastroenterology

## 2021-09-25 ENCOUNTER — Ambulatory Visit (INDEPENDENT_AMBULATORY_CARE_PROVIDER_SITE_OTHER): Payer: 59 | Admitting: Internal Medicine

## 2021-09-25 ENCOUNTER — Encounter: Payer: Self-pay | Admitting: Internal Medicine

## 2021-09-25 VITALS — BP 112/89 | HR 68 | Temp 98.0°F | Resp 12 | Ht 63.5 in | Wt 173.0 lb

## 2021-09-25 DIAGNOSIS — E669 Obesity, unspecified: Secondary | ICD-10-CM

## 2021-09-25 DIAGNOSIS — I10 Essential (primary) hypertension: Secondary | ICD-10-CM | POA: Diagnosis not present

## 2021-09-25 DIAGNOSIS — Z119 Encounter for screening for infectious and parasitic diseases, unspecified: Secondary | ICD-10-CM

## 2021-09-25 DIAGNOSIS — E782 Mixed hyperlipidemia: Secondary | ICD-10-CM | POA: Diagnosis not present

## 2021-09-25 DIAGNOSIS — R0683 Snoring: Secondary | ICD-10-CM

## 2021-09-25 DIAGNOSIS — Z0000 Encounter for general adult medical examination without abnormal findings: Secondary | ICD-10-CM

## 2021-09-25 DIAGNOSIS — Z8262 Family history of osteoporosis: Secondary | ICD-10-CM

## 2021-09-25 DIAGNOSIS — E785 Hyperlipidemia, unspecified: Secondary | ICD-10-CM

## 2021-09-25 HISTORY — DX: Snoring: R06.83

## 2021-09-25 HISTORY — DX: Obesity, unspecified: E66.9

## 2021-09-25 HISTORY — DX: Hyperlipidemia, unspecified: E78.5

## 2021-09-25 MED ORDER — LOSARTAN POTASSIUM-HCTZ 50-12.5 MG PO TABS: 1.0000 | ORAL_TABLET | Freq: Every day | ORAL | 3 refills | Status: DC

## 2021-09-25 NOTE — Assessment & Plan Note (Addendum)
Offered sleep study referral  Suspect osa, esp worrisome due to high bp and female gender for ? afib->CVA so I suggest asa 81 daily for now.

## 2021-09-25 NOTE — Progress Notes (Signed)
Phone [phone]  Routine Medical Office Visit  Date:    '@DATE'$ @  Patient Name:  Kaylee Lopez  Healthcare Provider:  Loralee Pacas, MD   Chief Complaint:   Chief Complaint  Patient presents with   Establish Care    Discuss BP diastolic reading being high sometimes. Wonders if BP medication is working sometimes. Fasting today.     Assessment/Plan:   Kaylee Lopez was seen today for establish care.  Preventative health care  Screening examination for infectious disease  Moderate mixed hyperlipidemia not requiring statin therapy Overview: Lipid Panel     Component Value Date/Time   CHOL 218 (H) 12/20/2019 1516   TRIG 126 12/20/2019 1516   HDL 57 12/20/2019 1516   CHOLHDL 3.8 12/20/2019 1516   LDLCALC 139 (H) 12/20/2019 1516   LABVLDL 22 12/20/2019 1516  she has been eating better and it improved in 03/2021 follow up   Essential hypertension -     Losartan Potassium-HCTZ; Take 1 tablet by mouth daily.  Dispense: 90 tablet; Refill: 3  Hypertension, unspecified type Assessment & Plan: Chronic stable controlled Will rf as needed Goal 140/90 explained Avoid salt/alcohol/nsaids  Orders: -     Losartan Potassium-HCTZ; Take 1 tablet by mouth daily.  Dispense: 90 tablet; Refill: 3  Obesity (BMI 30-39.9) Overview: Encouraged low calorie diet and exercise  Assessment & Plan: Offered pills / injections Encouraged continue diet and exercise. Encouraged stop drinking wine. She will call if she wants meds for appointment sooner will do heart healthy low calorie for diet Discussed importance of resistance training.   Snoring Assessment & Plan: Offered sleep study referral  Suspect osa, esp worrisome due to high bp and female gender for ? afib->CVA so I suggest asa 81 daily for now.   Orders: -     Ambulatory referral to Neurology  Family history of osteoporosis Overview: Encouraged calcium and vitamin D Gets q5 yr DEXA from gyn next due 2024        Completed All the Following Health Maintenance Counseling and Anticipatory Guidance:  #  Eye exams: recommended yearly - gets with Ch[facility] Greathouse at [facility] #  Dental health: recommended regular tooth brushing, flossing, and dental visits every 6 months #  Sinus health- encouraged simply saline. #  Diet/Exercise:   recommended regular exercise and diet rich and fruits and vegetables and healthy fats to reduce risk of heart attack and stroke.  #  Sexuality: recommended STD prevention via partner selection & condoms.  Offered (if desired) contraception / STD checks / genital wart treatment.   #  Cervical cancer screening: Recommended pap smear with hpv every 5 yrs from 25-65.  Offered gyn or completion here in accordance with patient preferences. #  Breast cancer screening:   last breast exam  and last mammogram within past year managed by obgyn. #  Colon cancer screening:  denies family history of colon cancer or any GI bleeding,  gets q88yrand she plans to make appointment soon with her gi. #  Skin cancer screening: recommended regular sunscreen use. she denies worrisome, changing, or new skin lesions. She follows with derm #  Osteoporosis screening at 647  she has family history in Mom.  Ob/gyn scanning q 5 yrs next due 2024.  recommended to maintain a good source of calcium and vitamin D in diet.  She denies any personal history of early osteoporosis or fragility fractures #  Substance use: recommended absolute abstinence from all vaped or smoked recreational or illicit  substances of abuse such as tobacco, nicotine, alcohol, illicit drugs, even sugar.   #  Health maintenance and immunizations reviewed and she was encouraged to complete any incomplete and release any missing immunization records for [place] Immunization History  Administered Date(s) Administered   Influenza,inj,Quad PF,6+ Mos 09/07/2017, 10/23/2018   Influenza-Unspecified 11/12/2015, 11/06/2019, 11/06/2019, 10/11/2020,  11/08/2020, 09/16/2021   Moderna Sars-Covid-2 Vaccination 01/16/2019, 02/13/2019   PFIZER Comirnaty(Gray Top)Covid-19 Tri-Sucrose Vaccine 11/06/2019   Tdap 01/11/2010, 03/12/2020   Zoster Recombinat (Shingrix) 11/19/2016, 03/11/2017   Health Maintenance Due  Topic Date Due   Hepatitis C Screening  Never done   COVID-19 Vaccine (4 - Moderna series) 01/01/2020   PAP SMEAR-Modifier  01/17/2021   COLONOSCOPY (Pts 45-63yr Insurance coverage will need to be confirmed)  07/21/2021    We discussed vaccines- she got flu and covid vaccine last week and will get colonoscopy with gi and pap smear with gyn..   Recommended follow up: Return in about 1 year (around 09/26/2022) for Annual exam, please come fasting.    Subjective:   Kaylee Tillisis a 56y.o. female who presents today for her annual comprehensive physical exam.   She has the following specific preventive medicine concerns for today: none She has the following specific medical problems she would like to discuss: none   She has gym membership Walks dogs 1-1.5 mi daily Has personal trainer Does intensive gardening and yardwork Diet: watches carbs does balance avoids junk food and fast food, avoids ice cream.  Does like crackers and salty savory like chips.    ROS  A comprehensive ROS was completed by worksheet today and was negative for all of the following unless otherwise indicated above: Constitutional:   chills, diaphoresis, fever, malaise/fatigue and weight loss.  HENT:  congestion, ear discharge, ear pain, hearing loss, nosebleeds, sinus pain, sore throat and tinnitus.   Eyes:  blurred vision, double vision, photophobia, pain, discharge and redness.  Respiratory:  cough, hemoptysis, sputum production, shortness of breath, wheezing and stridor.   Cardiovascular:  chest pain, palpitations, orthopnea, claudication, leg swelling and PND.  Gastrointestinal:  abdominal pain, blood in stool, constipation, diarrhea, heartburn,  melena, nausea and vomiting.  Genitourinary:  dysuria, flank pain, frequency, hematuria and urgency.  Musculoskeletal:   back pain, falls, joint pain, myalgias and neck pain.  Skin:   itching and rash.  Neurological:  dizziness, tingling, tremors, sensory change, speech change, focal weakness, seizures, loss of consciousness, weakness and headaches.  Endo/Heme/Allergies: environmental allergies and polydipsia. Does not bruise/bleed easily.  Psychiatric/Behavioral:  depression, hallucinations, memory loss, substance abuse and suicidal ideas. The patient is not nervous/anxious and does not have insomnia.      PROBLEMS,PMH, PSH, FH, prior meds, allergies, and SH were each reviewed and updated Patient Active Problem List   Diagnosis Date Noted   Hyperlipidemia 09/25/2021   Obesity (BMI 30-39.9) 09/25/2021   Snoring 09/25/2021   Family history of osteoporosis 09/25/2021   Seasonal allergies 03/24/2021   Hypertensive disorder 01/30/2019   Past Medical History:  Diagnosis Date   Allergy    Anxiety    Bacterial meningitis 1986   Family history of osteoporosis 09/25/2021   Encouraged calcium and vitamin D Gets q5 yr DEXA from gyn next due 2024   GERD (gastroesophageal reflux disease)    HTN (hypertension)    Hyperlipidemia 09/25/2021   Hypertension    Phreesia 12/18/2019   Obesity (BMI 30-39.9) 09/25/2021   Snoring 09/25/2021    Past Surgical History:  Procedure Laterality Date  NO PAST SURGERIES     Family History  Problem Relation Age of Onset   Kidney disease Mother    Heart disease Mother    Hypertension Mother    Heart disease Father    Arthritis Father    Hyperlipidemia Father    Prostate cancer Father    Colon polyps Father        Pro b n peptide 281   Kidney disease Maternal Aunt    Diabetes Maternal Aunt    Breast cancer Paternal Aunt    Breast cancer Paternal Aunt    Diabetes Maternal Grandmother    Breast cancer Maternal Grandmother    Heart attack Maternal  Grandmother    Heart disease Maternal Grandmother    Intellectual disability Maternal Grandfather    Heart disease Maternal Grandfather    Hypertension Maternal Grandfather    Kidney disease Maternal Grandfather    Early death Paternal Grandmother    Stroke Paternal Grandmother    Hypertension Paternal Grandfather    Hyperlipidemia Paternal Grandfather    Hearing loss Paternal Grandfather    Depression Paternal Grandfather    Alcohol abuse Paternal Grandfather    Outpatient Medications Prior to Visit  Medication Sig Dispense Refill   Multiple Vitamin (MULTIVITAMIN) tablet Take 1 tablet by mouth daily.     Calcium Citrate-Vitamin D (CITRACAL MAXIMUM PO) Take by mouth.     cetirizine (ZYRTEC) 10 MG tablet Take 1 tablet (10 mg total) by mouth daily. 90 tablet 3   fluticasone (FLONASE) 50 MCG/ACT nasal spray SHAKE LIQUID AND USE 2 SPRAYS IN EACH NOSTRIL AT BEDTIME 16 g 11   influenza vac split quadrivalent PF (AFLURIA QUADRIVALENT) 0.5 ML injection Afluria Qd 2020-21 (36 mos up)(PF)60 mcg (15 mcg x4)/0.5 mL IM syringe  PHARMACY ADMINISTERED     Loratadine (CLARITIN PO) Claritin     olopatadine (PATANOL) 0.1 % ophthalmic solution Place 1 drop into both eyes 2 (two) times daily. 5 mL 5   polyethylene glycol powder (GLYCOLAX/MIRALAX) powder Take 17 g by mouth 2 (two) times daily as needed. 3350 g 1   losartan-hydrochlorothiazide (HYZAAR) 50-12.5 MG tablet Take 1 tablet by mouth daily. 90 tablet 3   No facility-administered medications prior to visit.    Allergies  Allergen Reactions   Dust Mite Extract    Other Other (See Comments) and Cough    Watery eyes, post nasal drip,etc.    Social History   Tobacco Use   Smoking status: Never   Smokeless tobacco: Never  Vaping Use   Vaping Use: Never used  Substance Use Topics   Alcohol use: Yes    Alcohol/week: 10.0 standard drinks of alcohol    Types: 10 Glasses of wine per week    Comment: pt will have 2-3 drinks 4 times a week on  average    Drug use: Never      Objective:  BP 112/89 (BP Location: Left Arm, Patient Position: Sitting)   Pulse 68   Temp 98 F (36.7 C) (Temporal)   Resp 12   Ht 5' 3.5" (1.613 m)   Wt 173 lb (78.5 kg)   SpO2 96%   BMI 30.16 kg/m  Body mass index is 30.16 kg/m.  Wt Readings from Last 3 Encounters:  09/25/21 173 lb (78.5 kg)  03/12/20 178 lb (80.7 kg)  12/20/19 181 lb (82.1 kg)    This is a polite, friendly, and genuine person who was a pleasure to meet.  Constitutional: NAD, AAO, not ill-appearing  HENT:  NCAT, normal nose, mucous membranes moist Eyes:  sclera nonicteric, no injection CV:  RRR, no murmurs/rubs/gallops Lung: CTAB, normal WOB, no audible wheezing or stridor Abd: soft, nondistended, no guarding, no palpable tumor Skin: warm, dry, no lesions of concern Neuro: alert, no focal deficit obvious, articulate speech Psych: normal mood, behavior, thought content  A pelvic and breast examination wasdeclined since she follows with a gynecologist.

## 2021-09-25 NOTE — Assessment & Plan Note (Signed)
Chronic stable controlled Will rf as needed Goal 140/90 explained Avoid salt/alcohol/nsaids

## 2021-09-25 NOTE — Assessment & Plan Note (Addendum)
Offered pills / injections Encouraged continue diet and exercise. Encouraged stop drinking wine. She will call if she wants meds for appointment sooner will do heart healthy low calorie for diet Discussed importance of resistance training.

## 2021-09-25 NOTE — Patient Instructions (Addendum)
It was a pleasure seeing you today!  Today the plan is...  There is no need to make any changes to your medications  Preventative health care  Screening examination for infectious disease  Moderate mixed hyperlipidemia not requiring statin therapy  Essential hypertension -     Losartan Potassium-HCTZ; Take 1 tablet by mouth daily.  Dispense: 90 tablet; Refill: 3  Hypertension, unspecified type Assessment & Plan: Chronic stable controlled Will rf as needed Goal 140/90 explained Avoid salt/alcohol/nsaids  Orders: -     Losartan Potassium-HCTZ; Take 1 tablet by mouth daily.  Dispense: 90 tablet; Refill: 3  Obesity (BMI 30-39.9) Assessment & Plan: Offered pills / injections Encouraged continue diet and exercise. Encouraged stop drinking wine.   Snoring Assessment & Plan: Offered sleep study referral    Orders: -     Ambulatory referral to Neurology      Loralee Pacas, MD   Return in about 1 year (around 09/26/2022) for Annual exam, please come fasting.    - Please bring all your medicines to your next appointment. This is the best way for me to know exactly what you're taking.  - If your condition begins to worsen or become severe:  go to the ER. - If your condition fails to resolve or you have other questions / concerns: please contact me via phone [phone] or MyChart messaging.     IF you received an x-ray today, you will receive an invoice from [facility]. Please contact [facility] at [phone] with questions or concerns regarding your invoice.    IF you received labwork today, you will receive an invoice from [facility]. Please contact [facility] at [phone] with questions or concerns regarding your invoice.    Our billing staff will not be able to assist you with questions regarding bills from these companies.   You will be contacted with the lab results as soon as they are available. The fastest way to get your results is to  activate your My Chart account. Instructions are located on the last page of this paperwork. If you have not heard from [place] regarding the results in 2 weeks, please contact this office. For any labs or imaging tests, we will call you if the results are significantly abnormal.  Most normal results will be posted to myChart as soon as they are available and I will comment on them there within 2-3 business days.

## 2021-09-29 ENCOUNTER — Ambulatory Visit: Payer: 59 | Admitting: Internal Medicine

## 2021-10-06 ENCOUNTER — Encounter: Payer: Self-pay | Admitting: Gastroenterology

## 2021-10-09 ENCOUNTER — Other Ambulatory Visit: Payer: Self-pay | Admitting: Obstetrics and Gynecology

## 2021-10-09 DIAGNOSIS — Z803 Family history of malignant neoplasm of breast: Secondary | ICD-10-CM

## 2021-10-21 ENCOUNTER — Ambulatory Visit (AMBULATORY_SURGERY_CENTER): Payer: Self-pay

## 2021-10-21 VITALS — Ht 63.5 in | Wt 170.0 lb

## 2021-10-21 DIAGNOSIS — Z86010 Personal history of colonic polyps: Secondary | ICD-10-CM

## 2021-10-21 MED ORDER — NA SULFATE-K SULFATE-MG SULF 17.5-3.13-1.6 GM/177ML PO SOLN: 1.0000 | Freq: Once | ORAL | 0 refills | Status: AC

## 2021-10-21 NOTE — Progress Notes (Signed)
No egg or soy allergy known to patient  No issues known to pt with past sedation with any surgeries or procedures Patient denies ever being told they had issues or difficulty with intubation  No FH of Malignant Hyperthermia Pt is not on diet pills Pt is not on home 02  Pt is not on blood thinners  Pt reports issues with constipation -patient reports she has been increasing her water intake and taking Miralax if needed, and drinks a hot tea that has herbs and Senna in it; she also reports changing her diet and activity to assist with constipation;  No A fib or A flutter Have any cardiac testing pending--NO Pt instructed to use [email or web address] or GoodRx for a price reduction on prep  Insurance verified during [facility] appt=[employer] Patient verbally advised not to take Phentermine for 10 days prior to her procedure- patient reported that she "does not like how it makes me feel - I don't think I will be taking it any time soon- I am especially not going to take it before my colonoscopy";  Patient verbally advised by RN to hold off on taking Phentermine for 10 days prior to procedure; Patient verbalized understanding of information/instructions;

## 2021-11-02 ENCOUNTER — Encounter: Payer: Self-pay | Admitting: Gastroenterology

## 2021-11-03 ENCOUNTER — Ambulatory Visit (INDEPENDENT_AMBULATORY_CARE_PROVIDER_SITE_OTHER): Payer: 59 | Admitting: Neurology

## 2021-11-03 ENCOUNTER — Encounter: Payer: Self-pay | Admitting: Neurology

## 2021-11-03 VITALS — BP 134/81 | HR 73 | Ht 63.0 in | Wt 172.0 lb

## 2021-11-03 DIAGNOSIS — R0683 Snoring: Secondary | ICD-10-CM | POA: Diagnosis not present

## 2021-11-03 DIAGNOSIS — Z9189 Other specified personal risk factors, not elsewhere classified: Secondary | ICD-10-CM

## 2021-11-03 DIAGNOSIS — R0681 Apnea, not elsewhere classified: Secondary | ICD-10-CM | POA: Diagnosis not present

## 2021-11-03 DIAGNOSIS — E669 Obesity, unspecified: Secondary | ICD-10-CM

## 2021-11-03 DIAGNOSIS — G4719 Other hypersomnia: Secondary | ICD-10-CM | POA: Diagnosis not present

## 2021-11-03 NOTE — Progress Notes (Signed)
Subjective:    Patient ID: Kaylee Lopez is a 56 y.o. female.  HPI    Star Age, MD, PhD [facility] [address] [place], [place] [address]  Dear Dr. Randol Kern,  I saw your patient, Kaylee Lopez, upon your kind request in my sleep clinic today for initial consultation of her sleep disorder, in particular, concern for underlying obstructive sleep apnea.  The patient is unaccompanied today.  As you know, Kaylee Lopez is a 56 year old female with an underlying medical history of hypertension, hyperlipidemia, history of bacterial meningitis as a teenager, reflux disease, anxiety, and borderline obesity, who reports snoring and excessive daytime somnolence, as well as occasional witnessed apneas per husband's report.  She reports occasionally waking up with a sense of gasping for air.  She has no family history of sleep apnea.  She reports that about a year ago they bought an adjustable bed but it does not seem to help very much.  She does not wake up fully rested.  She goes to bed around 10:30 PM or as late as 11:30 PM and rise time is around 6 AM.  She works as a Research scientist (life sciences).  She lives with her husband, both kids are grown, older son is working in [place], daughter is a Paramedic in Secretary/administ[employer] and is currently in [place], for study abroad.  They have 3 dogs in the household.   Patient is a non-smoker.  She drinks alcohol up to 4 times a week, she is working on reducing her regular alcohol consumption.  She limits her caffeine to 1 cup of coffee in the morning typically.  She has had blood pressure fluctuation, she is working on weight loss, mostly has plateaued.  She had slow weight gain over the past 5 years or so..  I reviewed your office note from 09/25/2021.  Her Epworth sleepiness score is 8 out of 24, fatigue severity score is 40 out of 63. She denies night to night nocturia or recurrent morning or nocturnal headaches.   Her Past  Medical History Is Significant For: Past Medical History:  Diagnosis Date   Anxiety    Bacterial meningitis 1986   Family history of osteoporosis 09/25/2021   Encouraged calcium and vitamin D Gets q5 yr DEXA from gyn next due 2024   GERD (gastroesophageal reflux disease)    hx of   HTN (hypertension)    on meds   Hyperlipidemia 09/25/2021   diet controlled   Hypertension    Phreesia 12/18/2019   Obesity (BMI 30-39.9) 09/25/2021   Snoring 09/25/2021    Her Past Surgical History Is Significant For: Past Surgical History:  Procedure Laterality Date   COLONOSCOPY  2018   MS-MAC-suprep(exc)-TA   minor skin surgery     POLYPECTOMY  2018   TA   [place]    Her Family History Is Significant For: Family History  Problem Relation Age of Onset   Kidney disease Mother    Heart disease Mother    Hypertension Mother    Congestive Heart Failure Mother    Heart disease Father    Arthritis Father    Hyperlipidemia Father    Prostate cancer Father    Colon polyps Father 82       Pro b n peptide 281   Diabetes Maternal Grandmother    Breast cancer Maternal Grandmother    Heart attack Maternal Grandmother    Heart disease Maternal Grandmother  Intellectual disability Maternal Grandfather    Heart disease Maternal Grandfather    Hypertension Maternal Grandfather    Kidney disease Maternal Grandfather    Early death Paternal Grandmother    Stroke Paternal Grandmother    Aneurysm Paternal Grandmother    Hyperlipidemia Paternal Grandfather    Hearing loss Paternal Grandfather    Depression Paternal Grandfather    Alcohol abuse Paternal Grandfather    Hypertension Maternal Aunt    Kidney disease Maternal Aunt    Diabetes Maternal Aunt    Breast cancer Paternal Aunt    Breast cancer Paternal Aunt    Esophageal cancer Neg Hx    Colon cancer Neg Hx    Rectal cancer Neg Hx    Stomach cancer Neg Hx    Sleep apnea Neg Hx     Her Social History Is  Significant For: Social History   Socioeconomic History   Marital status: Married    Spouse name: Not on file   Number of children: 2   Years of education: Not on file   Highest education level: Bachelor's degree (e.g., BA, AB, BS)  Occupational History   Occupation: physical therapist  Tobacco Use   Smoking status: Never   Smokeless tobacco: Never  Vaping Use   Vaping Use: Never used  Substance and Sexual Activity   Alcohol use: Yes    Alcohol/week: 4.0 standard drinks of alcohol    Types: 4 Standard drinks or equivalent per week    Comment: previous: pt will have 2-3 drinks 4 times a week on average   Drug use: Never   Sexual activity: Yes    Birth control/protection: Post-menopausal  Other Topics Concern   Not on file  Social History Narrative   Lives at home with husband   Caffeine: 1 cup/day   Social Determinants of Health   Financial Resource Strain: Not on file  Food Insecurity: Not on file  Transportation Needs: Not on file  Physical Activity: Not on file  Stress: Not on file  Social Connections: Not on file    Her Allergies Are:  Allergies  Allergen Reactions   Dust Mite Extract Hives, Itching, Rash and Cough  :   Her Current Medications Are:  Outpatient Encounter Medications as of 11/03/2021  Medication Sig   aspirin EC 81 MG tablet Take 81 mg by mouth daily. Swallow whole.   Calcium Citrate-Vitamin D (CITRACAL MAXIMUM PO) Take by mouth.   cetirizine (ZYRTEC) 10 MG tablet Take 1 tablet (10 mg total) by mouth daily. (Patient taking differently: Take 10 mg by mouth daily as needed.)   Cholecalciferol (VITAMIN D3 PO) Take 1 each by mouth daily.   fluticasone (FLONASE) 50 MCG/ACT nasal spray SHAKE LIQUID AND USE 2 SPRAYS IN EACH NOSTRIL AT BEDTIME (Patient taking differently: Place 2 sprays into both nostrils at bedtime as needed for allergies. SHAKE LIQUID AND USE 2 SPRAYS IN EACH NOSTRIL AT BEDTIME)   Loratadine (CLARITIN PO)     losartan-hydrochlorothiazide (HYZAAR) 50-12.5 MG tablet Take 1 tablet by mouth daily.   Multiple Vitamin (MULTIVITAMIN) tablet Take 1 tablet by mouth daily.   olopatadine (PATANOL) 0.1 % ophthalmic solution Place 1 drop into both eyes 2 (two) times daily. (Patient taking differently: Place 1 drop into both eyes daily as needed (dry eyes).)   polyethylene glycol powder (GLYCOLAX/MIRALAX) powder Take 17 g by mouth 2 (two) times daily as needed. (Patient taking differently: Take 17 g by mouth daily as needed.)   phentermine (ADIPEX-P) 37.5 MG  tablet Take 37.5 mg by mouth daily. (Patient not taking: Reported on 11/03/2021)   No facility-administered encounter medications on file as of 11/03/2021.  :   Review of Systems:  Out of a complete 14 point review of systems, all are reviewed and negative with the exception of these symptoms as listed below:  Review of Systems  Neurological:        Patient is here alone for sleep consult. She endorses snoring, states her husband tries to reposition her but it doesn't help. Sometimes she wakes up gasping for air. She feels tired a lot in the mornings and it is hard to get up. She doesn't feel rested. Her PCP is concerned about her weight. She states she has recently been told by her dentist that she grinds her teeth. ESS 8, FSS 40.     Objective:  Neurological Exam  Physical Exam Physical Examination:   Vitals:   11/03/21 0922  BP: 134/81  Pulse: 73    General Examination: The patient is a very pleasant 56 y.o. female in no acute distress. She appears well-developed and well-nourished and well groomed.   HEENT: Normocephalic, atraumatic, pupils are equal, round and reactive to light, extraocular tracking is good without limitation to gaze excursion or nystagmus noted. Hearing is grossly intact. Face is symmetric with normal facial animation. Speech is clear with no dysarthria noted. There is no hypophonia. There is no lip, neck/head, jaw or voice  tremor. Neck is supple with full range of passive and active motion. There are no carotid bruits on auscultation. Oropharynx exam reveals: No significant mouth dryness, good dental hygiene, mild airway crowding secondary to small airway entry and elongated uvula, tonsils on the small side, Mallampati class II.  Neck circumference of 14.8 inches, mild to moderate overbite.  Tongue protrudes centrally and palate elevates symmetrically.    Chest: Clear to auscultation without wheezing, rhonchi or crackles noted.  Heart: S1+S2+0, regular and normal without murmurs, rubs or gallops noted.   Abdomen: Soft, non-tender and non-distended.  Extremities: There is no pitting edema in the distal lower extremities bilaterally.   Skin: Warm and dry without trophic changes noted.   Musculoskeletal: exam reveals no obvious joint deformities.   Neurologically:  Mental status: The patient is awake, alert and oriented in all 4 spheres. Her immediate and remote memory, attention, language skills and fund of knowledge are appropriate. There is no evidence of aphasia, agnosia, apraxia or anomia. Speech is clear with normal prosody and enunciation. Thought process is linear. Mood is normal and affect is normal.  Cranial nerves II - XII are as described above under HEENT exam.  Motor exam: Normal bulk, strength and tone is noted. There is no obvious action or resting tremor.  Fine motor skills and coordination: grossly intact.  Cerebellar testing: No dysmetria or intention tremor. There is no truncal or gait ataxia.  Sensory exam: intact to light touch in the upper and lower extremities.  Gait, station and balance: She stands easily. No veering to one side is noted. No leaning to one side is noted. Posture is age-appropriate and stance is narrow based. Gait shows normal stride length and normal pace. No problems turning are noted.   Assessment and plan:  In summary, Kaylee Lopez is a very pleasant 56 y.o.-year  old female with an underlying medical history of hypertension, hyperlipidemia, history of bacterial meningitis as a teenager, reflux disease, anxiety, and borderline obesity, whose history and physical exam are concerning for sleep disordered breathing,  supporting a current working diagnosis of unspecified sleep apnea, with the main differential diagnoses of obstructive sleep apnea (OSA) versus upper airway resistance syndrome (UARS) versus central sleep apnea (CSA), or mixed sleep apnea. A laboratory attended sleep study is considered gold standard for evaluation of sleep disordered breathing and is recommended at this time and clinically justified.   I had a long chat with the patient about my findings and the diagnosis of sleep apnea, particularly OSA, its prognosis and treatment options. We talked about medical/conservative treatments, surgical interventions and non-pharmacological approaches for symptom control. I explained, in particular, the risks and ramifications of untreated moderate to severe OSA, especially with respect to developing cardiovascular disease down the road, including congestive heart failure (CHF), difficult to treat hypertension, cardiac arrhythmias (particularly A-fib), neurovascular complications including TIA, stroke and dementia. Even type 2 diabetes has, in part, been linked to untreated OSA. Symptoms of untreated OSA may include (but may not be limited to) daytime sleepiness, nocturia (i.e. frequent nighttime urination), memory problems, mood irritability and suboptimally controlled or worsening mood disorder such as depression and/or anxiety, lack of energy, lack of motivation, physical discomfort, as well as recurrent headaches, especially morning or nocturnal headaches. We talked about the importance of maintaining a healthy lifestyle and striving for healthy weight. In addition, we talked about the importance of striving for and maintaining good sleep hygiene, particularly  making enough time for sleep. I recommended the following at this time: sleep study.  I outlined the differences between a laboratory attended sleep study which is considered more comprehensive and accurate over the option of a home sleep test (HST); the latter may lead to underestimation of sleep disordered breathing in some instances and does not help with diagnosing upper airway resistance syndrome and is not accurate enough to diagnose primary central sleep apnea typically. I explained the different sleep test procedures to the patient in detail and also outlined possible surgical and non-surgical treatment options of OSA, including the use of a pressure airway pressure (PAP) device (ie CPAP, AutoPAP/APAP or BiPAP in certain circumstances), a custom-made dental device (aka oral appliance, which would require a referral to a specialist dentist or orthodontist typically, and is generally speaking not considered a good choice for patients with full dentures or edentulous state), upper airway surgical options, such as traditional UPPP (which is not considered a first-line treatment) or the Inspire device (hypoglossal nerve stimulator, which would involve a referral for consultation with an ENT surgeon, after careful selection, following inclusion criteria). I explained the PAP treatment option to the patient in detail, as this is generally considered first-line treatment.  The patient indicated that she would be willing to try PAP therapy, if the need arises. I explained the importance of being compliant with PAP treatment, not only for insurance purposes but primarily to improve patient's symptoms symptoms, and for the patient's long term health benefit, including to reduce Her cardiovascular risks longer-term.    We will pick up our discussion about the next steps and treatment options after testing.  We will keep her posted as to the test results by phone call and/or MyChart messaging where possible.  We will  plan to follow-up in sleep clinic accordingly as well.  I answered all her questions today and the patient was in agreement.   I encouraged her to call with any interim questions, concerns, problems or updates or email [place] through [place].  Generally speaking, sleep test authorizations may take up to 2 weeks, sometimes less, sometimes  longer, the patient is encouraged to get in touch with [place] if they do not hear back from the sleep lab staff directly within the next 2 weeks.  Thank you very much for allowing me to participate in the care of this nice patient. If I can be of any further assistance to you please do not hesitate to call me at [phone].  Sincerely,   Star Age, MD, PhD

## 2021-11-03 NOTE — Patient Instructions (Signed)

## 2021-11-05 ENCOUNTER — Encounter: Payer: Self-pay | Admitting: Certified Registered Nurse Anesthetist

## 2021-11-10 ENCOUNTER — Ambulatory Visit (AMBULATORY_SURGERY_CENTER): Payer: 59 | Admitting: Gastroenterology

## 2021-11-10 ENCOUNTER — Encounter: Payer: Self-pay | Admitting: Gastroenterology

## 2021-11-10 VITALS — BP 125/70 | HR 66 | Temp 97.5°F | Resp 11 | Ht 63.5 in | Wt 170.0 lb

## 2021-11-10 DIAGNOSIS — D123 Benign neoplasm of transverse colon: Secondary | ICD-10-CM | POA: Diagnosis not present

## 2021-11-10 DIAGNOSIS — Z09 Encounter for follow-up examination after completed treatment for conditions other than malignant neoplasm: Secondary | ICD-10-CM | POA: Diagnosis present

## 2021-11-10 DIAGNOSIS — D120 Benign neoplasm of cecum: Secondary | ICD-10-CM

## 2021-11-10 DIAGNOSIS — D122 Benign neoplasm of ascending colon: Secondary | ICD-10-CM

## 2021-11-10 DIAGNOSIS — D128 Benign neoplasm of rectum: Secondary | ICD-10-CM

## 2021-11-10 DIAGNOSIS — Z86010 Personal history of colonic polyps: Secondary | ICD-10-CM

## 2021-11-10 DIAGNOSIS — D125 Benign neoplasm of sigmoid colon: Secondary | ICD-10-CM

## 2021-11-10 MED ORDER — SODIUM CHLORIDE 0.9 % IV SOLN
500.0000 mL | Freq: Once | INTRAVENOUS | Status: DC
Start: 2021-11-10 — End: 2021-11-10

## 2021-11-10 NOTE — Progress Notes (Signed)
Called to room to assist during endoscopic procedure.  Patient ID and intended procedure confirmed with present staff. Received instructions for my participation in the procedure from the performing physician.  

## 2021-11-10 NOTE — Progress Notes (Signed)
Pt's states no medical or surgical changes since previsit or office visit. 

## 2021-11-10 NOTE — Patient Instructions (Signed)
Await pathology results.  Handout on polyps and hemorrhoids provided.  YOU HAD AN ENDOSCOPIC PROCEDURE TODAY AT [facility]:   Refer to the procedure report that was given to you for any specific questions about what was found during the examination.  If the procedure report does not answer your questions, please call your gastroenterologist to clarify.  If you requested that your care partner not be given the details of your procedure findings, then the procedure report has been included in a sealed envelope for you to review at your convenience later.  YOU SHOULD EXPECT: Some feelings of bloating in the abdomen. Passage of more gas than usual.  Walking can help get rid of the air that was put into your GI tract during the procedure and reduce the bloating. If you had a lower endoscopy (such as a colonoscopy or flexible sigmoidoscopy) you may notice spotting of blood in your stool or on the toilet paper. If you underwent a bowel prep for your procedure, you may not have a normal bowel movement for a few days.  Please Note:  You might notice some irritation and congestion in your nose or some drainage.  This is from the oxygen used during your procedure.  There is no need for concern and it should clear up in a day or so.  SYMPTOMS TO REPORT IMMEDIATELY:  Following lower endoscopy (colonoscopy or flexible sigmoidoscopy):  Excessive amounts of blood in the stool  Significant tenderness or worsening of abdominal pains  Swelling of the abdomen that is new, acute  Fever of 100F or higher  For urgent or emergent issues, a gastroenterologist can be reached at any hour by calling [phone]. Do not use MyChart messaging for urgent concerns.    DIET:  We do recommend a small meal at first, but then you may proceed to your regular diet.  Drink plenty of fluids but you should avoid alcoholic beverages for 24 hours.  ACTIVITY:  You should plan to take it easy for the rest of today  and you should NOT DRIVE or use heavy machinery until tomorrow (because of the sedation medicines used during the test).    FOLLOW UP: Our staff will call the number listed on your records the next business day following your procedure.  We will call around 7:15- 8:00 am to check on you and address any questions or concerns that you may have regarding the information given to you following your procedure. If we do not reach you, we will leave a message.     If any biopsies were taken you will be contacted by phone or by letter within the next 1-3 weeks.  Please call us at [phone] if you have not heard about the biopsies in 3 weeks.    SIGNATURES/CONFIDENTIALITY: You and/or your care partner have signed paperwork which will be entered into your electronic medical record.  These signatures attest to the fact that that the information above on your After Visit Summary has been reviewed and is understood.  Full responsibility of the confidentiality of this discharge information lies with you and/or your care-partner.

## 2021-11-10 NOTE — Op Note (Signed)
[facility] Patient Name: Kaylee Lopez Procedure Date: 11/10/2021 3:42 PM MRN: [id number] Endoscopist: Ladene Artist , MD, [id number] Age: 56 Referring MD:  Date of Birth: Nov 26, 1965 Gender: Female Account #: [id number] Procedure:                Colonoscopy Indications:              Surveillance: Personal history of adenomatous                            polyps on last colonoscopy 5 years ago Medicines:                Monitored Anesthesia Care Procedure:                Pre-Anesthesia Assessment:                           - Prior to the procedure, a History and Physical                            was performed, and patient medications and                            allergies were reviewed. The patient's tolerance of                            previous anesthesia was also reviewed. The risks                            and benefits of the procedure and the sedation                            options and risks were discussed with the patient.                            All questions were answered, and informed consent                            was obtained. Prior Anticoagulants: The patient has                            taken no anticoagulant or antiplatelet agents. ASA                            Grade Assessment: II - A patient with mild systemic                            disease. After reviewing the risks and benefits,                            the patient was deemed in satisfactory condition to                            undergo the procedure.  After obtaining informed consent, the colonoscope                            was passed under direct vision. Throughout the                            procedure, the patient's blood pressure, pulse, and                            oxygen saturations were monitored continuously. The                            Olympus CF-HQ190L ([id number]) Colonoscope was                            introduced through  the anus and advanced to the the                            cecum, identified by appendiceal orifice and                            ileocecal valve. The ileocecal valve, appendiceal                            orifice, and rectum were photographed. The quality                            of the bowel preparation was good. The colonoscopy                            was performed without difficulty. Photo with the                            retroflexed view of the rectum did not capture.The                            patient tolerated the procedure well. Scope In: 4:06:42 PM Scope Out: 4:23:20 PM Scope Withdrawal Time: 0 hours 14 minutes 9 seconds  Total Procedure Duration: 0 hours 16 minutes 38 seconds  Findings:                 The perianal and digital rectal examinations were                            normal.                           Two sessile polyps were found in the ascending                            colon and cecum. The polyps were 8 to 15 mm in                            size. These polyps were removed with a cold snare.  Resection and retrieval were complete.                           Five sessile polyps were found in the rectum (1),                            sigmoid colon (1) and transverse colon (3). The                            polyps were 5 to 7 mm in size. These polyps were                            removed with a cold snare. Resection and retrieval                            were complete.                           Internal hemorrhoids were found during                            retroflexion. The hemorrhoids were small and Grade                            I (internal hemorrhoids that do not prolapse).                           The exam was otherwise without abnormality on                            direct and retroflexion views. Complications:            No immediate complications. Estimated blood loss:                             None. Estimated Blood Loss:     Estimated blood loss: none. Impression:               - Two 8 to 15 mm polyps in the ascending colon and                            in the cecum, removed with a cold snare. Resected                            and retrieved.                           - Five 5 to 7 mm polyps in the rectum, in the                            sigmoid colon and in the transverse colon, removed                            with a cold snare. Resected and retrieved.                           -  Internal hemorrhoids.                           - The examination was otherwise normal on direct                            and retroflexion views. Recommendation:           - Repeat colonoscopy, likely 3 years, after studies                            are complete for surveillance based on pathology                            results.                           - Patient has a contact number available for                            emergencies. The signs and symptoms of potential                            delayed complications were discussed with the                            patient. Return to normal activities tomorrow.                            Written discharge instructions were provided to the                            patient.                           - Resume previous diet.                           - Continue present medications.                           - Await pathology results. Ladene Artist, MD 11/10/2021 4:32:24 PM This report has been signed electronically.

## 2021-11-10 NOTE — Progress Notes (Signed)
History & Physical  Primary Care Physician:  Loralee Pacas, MD Primary Gastroenterologist: Lucio Edward, MD  CHIEF COMPLAINT:  Personal history of colon polyps   HPI: Kaylee Lopez is a 56 y.o. female with a personal history of adenomatous colon polyps for surveillance colonoscopy.   Past Medical History:  Diagnosis Date   Anxiety    Bacterial meningitis 1986   Family history of osteoporosis 09/25/2021   Encouraged calcium and vitamin D Gets q5 yr DEXA from gyn next due 2024   GERD (gastroesophageal reflux disease)    hx of   HTN (hypertension)    on meds   Hyperlipidemia 09/25/2021   diet controlled   Hypertension    Phreesia 12/18/2019   Obesity (BMI 30-39.9) 09/25/2021   Snoring 09/25/2021    Past Surgical History:  Procedure Laterality Date   COLONOSCOPY  2018   MS-MAC-suprep(exc)-TA   minor skin surgery     POLYPECTOMY  2018   TA   [facility]    Prior to Admission medications   Medication Sig Start Date End Date Taking? Authorizing Provider  aspirin EC 81 MG tablet Take 81 mg by mouth daily. Swallow whole.   Yes [provider]  Calcium Citrate-Vitamin D (CITRACAL MAXIMUM PO) Take by mouth.   Yes [provider]  cetirizine (ZYRTEC) 10 MG tablet Take 1 tablet (10 mg total) by mouth daily. Patient taking differently: Take 10 mg by mouth daily as needed. 11/01/18  Yes Forrest Moron, MD  Cholecalciferol (VITAMIN D3 PO) Take 1 each by mouth daily.   Yes [provider]  losartan-hydrochlorothiazide (HYZAAR) 50-12.5 MG tablet Take 1 tablet by mouth daily. 09/25/21  Yes Loralee Pacas, MD  Multiple Vitamin (MULTIVITAMIN) tablet Take 1 tablet by mouth daily.   Yes [provider]  olopatadine (PATANOL) 0.1 % ophthalmic solution Place 1 drop into both eyes 2 (two) times daily. Patient taking differently: Place 1 drop into both eyes daily as needed (dry eyes). 09/07/17  Yes Shawnee Knapp, MD  fluticasone  (FLONASE) 50 MCG/ACT nasal spray SHAKE LIQUID AND USE 2 SPRAYS IN EACH NOSTRIL AT BEDTIME Patient taking differently: Place 2 sprays into both nostrils at bedtime as needed for allergies. SHAKE LIQUID AND USE 2 SPRAYS IN EACH NOSTRIL AT BEDTIME 11/01/18   Forrest Moron, MD  Loratadine (CLARITIN PO)     [provider]  phentermine (ADIPEX-P) 37.5 MG tablet Take 37.5 mg by mouth daily. 10/09/21   [provider]  polyethylene glycol powder (GLYCOLAX/MIRALAX) powder Take 17 g by mouth 2 (two) times daily as needed. Patient taking differently: Take 17 g by mouth daily as needed. 08/05/14   McVeigh, Sherren Mocha, PA    Current Outpatient Medications  Medication Sig Dispense Refill   aspirin EC 81 MG tablet Take 81 mg by mouth daily. Swallow whole.     Calcium Citrate-Vitamin D (CITRACAL MAXIMUM PO) Take by mouth.     cetirizine (ZYRTEC) 10 MG tablet Take 1 tablet (10 mg total) by mouth daily. (Patient taking differently: Take 10 mg by mouth daily as needed.) 90 tablet 3   Cholecalciferol (VITAMIN D3 PO) Take 1 each by mouth daily.     losartan-hydrochlorothiazide (HYZAAR) 50-12.5 MG tablet Take 1 tablet by mouth daily. 90 tablet 3   Multiple Vitamin (MULTIVITAMIN) tablet Take 1 tablet by mouth daily.     olopatadine (PATANOL) 0.1 % ophthalmic solution Place 1 drop into both eyes 2 (two) times daily. (  Patient taking differently: Place 1 drop into both eyes daily as needed (dry eyes).) 5 mL 5   fluticasone (FLONASE) 50 MCG/ACT nasal spray SHAKE LIQUID AND USE 2 SPRAYS IN EACH NOSTRIL AT BEDTIME (Patient taking differently: Place 2 sprays into both nostrils at bedtime as needed for allergies. SHAKE LIQUID AND USE 2 SPRAYS IN EACH NOSTRIL AT BEDTIME) 16 g 11   Loratadine (CLARITIN PO)      phentermine (ADIPEX-P) 37.5 MG tablet Take 37.5 mg by mouth daily.     polyethylene glycol powder (GLYCOLAX/MIRALAX) powder Take 17 g by mouth 2 (two) times daily as needed. (Patient taking differently: Take  17 g by mouth daily as needed.) 3350 g 1   Current Facility-Administered Medications  Medication Dose Route Frequency Provider Last Rate Last Admin   0.9 %  sodium chloride infusion  500 mL Intravenous Once Ladene Artist, MD        Allergies as of 11/10/2021 - Review Complete 11/10/2021  Allergen Reaction Noted   Dust mite extract Hives, Itching, Rash, and Cough 09/25/2021    Family History  Problem Relation Age of Onset   Kidney disease Mother    Heart disease Mother    Hypertension Mother    Congestive Heart Failure Mother    Heart disease Father    Arthritis Father    Hyperlipidemia Father    Prostate cancer Father    Colon polyps Father 78       Pro b n peptide 281   Diabetes Maternal Grandmother    Breast cancer Maternal Grandmother    Heart attack Maternal Grandmother    Heart disease Maternal Grandmother    Intellectual disability Maternal Grandfather    Heart disease Maternal Grandfather    Hypertension Maternal Grandfather    Kidney disease Maternal Grandfather    Early death Paternal Grandmother    Stroke Paternal Grandmother    Aneurysm Paternal Grandmother    Hyperlipidemia Paternal Grandfather    Hearing loss Paternal Grandfather    Depression Paternal Grandfather    Alcohol abuse Paternal Grandfather    Hypertension Maternal Aunt    Kidney disease Maternal Aunt    Diabetes Maternal Aunt    Breast cancer Paternal Aunt    Breast cancer Paternal Aunt    Esophageal cancer Neg Hx    Colon cancer Neg Hx    Rectal cancer Neg Hx    Stomach cancer Neg Hx    Sleep apnea Neg Hx     Social History   Socioeconomic History   Marital status: Married    Spouse name: Not on file   Number of children: 2   Years of education: Not on file   Highest education level: Bachelor's degree (e.g., BA, AB, BS)  Occupational History   Occupation: physical therapist  Tobacco Use   Smoking status: Never   Smokeless tobacco: Never  Vaping Use   Vaping Use: Never used   Substance and Sexual Activity   Alcohol use: Yes    Alcohol/week: 4.0 standard drinks of alcohol    Types: 4 Standard drinks or equivalent per week    Comment: previous: pt will have 2-3 drinks 4 times a week on average   Drug use: Never   Sexual activity: Yes    Birth control/protection: Post-menopausal  Other Topics Concern   Not on file  Social History Narrative   Lives at home with husband   Caffeine: 1 cup/day   Social Determinants of Health   Financial Resource Strain:  Not on file  Food Insecurity: Not on file  Transportation Needs: Not on file  Physical Activity: Not on file  Stress: Not on file  Social Connections: Not on file  Intimate Partner Violence: Not on file    Review of Systems:  All systems reviewed were negative except where noted in HPI.   Physical Exam: General:  Alert, well-developed, in NAD Head:  Normocephalic and atraumatic. Eyes:  Sclera clear, no icterus.   Conjunctiva pink. Ears:  Normal auditory acuity. Mouth:  No deformity or lesions.  Neck:  Supple; no masses . Lungs:  Clear throughout to auscultation.   No wheezes, crackles, or rhonchi. No acute distress. Heart:  Regular rate and rhythm; no murmurs. Abdomen:  Soft, nondistended, nontender. No masses, hepatomegaly. No obvious masses.  Normal bowel .    Rectal:  Deferred   Msk:  Symmetrical without gross deformities.. Pulses:  Normal pulses noted. Extremities:  Without edema. Neurologic:  Alert and  oriented x4;  grossly normal neurologically. Skin:  Intact without significant lesions or rashes. Cervical Nodes:  No significant cervical adenopathy. Psych:  Alert and cooperative. Normal mood and affect.  Impression / Plan:   Personal history of adenomatous colon polyps for surveillance colonoscopy.  Pricilla Riffle. Fuller Plan  11/10/2021, 3:42 PM See Shea Evans, [facility], to contact our on call provider

## 2021-11-10 NOTE — Progress Notes (Signed)
Report given to PACU, vss 

## 2021-11-11 ENCOUNTER — Telehealth: Payer: Self-pay | Admitting: *Deleted

## 2021-11-11 NOTE — Telephone Encounter (Signed)
  Follow up Call-     11/10/2021    3:27 PM  Call back number  Post procedure Call Back phone  # [phone]  Permission to leave phone message Yes     Patient questions:  Do you have a fever, pain , or abdominal swelling? No. Pain Score  0 *  Have you tolerated food without any problems? Yes.    Have you been able to return to your normal activities? Yes.    Do you have any questions about your discharge instructions: Diet   No. Medications  No. Follow up visit  No.  Do you have questions or concerns about your Care? No.  Actions: * If pain score is 4 or above: No action needed, pain <4.

## 2021-11-12 ENCOUNTER — Encounter: Payer: Self-pay | Admitting: Internal Medicine

## 2021-11-12 DIAGNOSIS — Z86010 Personal history of colonic polyps: Secondary | ICD-10-CM | POA: Insufficient documentation

## 2021-11-12 DIAGNOSIS — Z860101 Personal history of adenomatous and serrated colon polyps: Secondary | ICD-10-CM | POA: Insufficient documentation

## 2021-11-23 ENCOUNTER — Other Ambulatory Visit: Payer: 59

## 2021-12-01 ENCOUNTER — Encounter: Payer: Self-pay | Admitting: Gastroenterology

## 2021-12-14 ENCOUNTER — Telehealth: Payer: Self-pay | Admitting: Neurology

## 2021-12-14 NOTE — Telephone Encounter (Signed)
[employer] pending uplaoded notes on the portal

## 2021-12-29 NOTE — Telephone Encounter (Signed)
[employer] denied the NPSG.  HST [employer] no auth req   left VM 12/28/21 [place]

## 2022-04-15 ENCOUNTER — Other Ambulatory Visit: Payer: Self-pay | Admitting: Obstetrics and Gynecology

## 2022-04-15 DIAGNOSIS — Z803 Family history of malignant neoplasm of breast: Secondary | ICD-10-CM

## 2022-08-26 ENCOUNTER — Encounter (INDEPENDENT_AMBULATORY_CARE_PROVIDER_SITE_OTHER): Payer: Self-pay

## 2022-09-27 ENCOUNTER — Encounter: Payer: 59 | Admitting: Internal Medicine

## 2022-11-05 ENCOUNTER — Other Ambulatory Visit: Payer: 59

## 2022-12-03 ENCOUNTER — Other Ambulatory Visit: Payer: Self-pay | Admitting: Internal Medicine

## 2022-12-03 DIAGNOSIS — I10 Essential (primary) hypertension: Secondary | ICD-10-CM

## 2023-06-08 ENCOUNTER — Other Ambulatory Visit: Payer: Self-pay | Admitting: Obstetrics and Gynecology

## 2023-06-08 DIAGNOSIS — Z803 Family history of malignant neoplasm of breast: Secondary | ICD-10-CM

## 2023-08-05 ENCOUNTER — Encounter: Payer: Self-pay | Admitting: Internal Medicine

## 2023-09-06 ENCOUNTER — Other Ambulatory Visit: Payer: Self-pay | Admitting: Internal Medicine

## 2023-09-06 DIAGNOSIS — I10 Essential (primary) hypertension: Secondary | ICD-10-CM

## 2023-09-08 ENCOUNTER — Other Ambulatory Visit: Payer: Self-pay | Admitting: Internal Medicine

## 2023-09-08 DIAGNOSIS — I10 Essential (primary) hypertension: Secondary | ICD-10-CM
